# Patient Record
Sex: Male | Born: 1960 | Race: White | Hispanic: No | Marital: Married
Health system: Midwestern US, Community
[De-identification: ages and names within clinical notes are randomized; demographics above are authoritative.]

## PROBLEM LIST (undated history)

## (undated) DIAGNOSIS — K649 Unspecified hemorrhoids: Secondary | ICD-10-CM

## (undated) HISTORY — DX: Unspecified hemorrhoids: K64.9

## (undated) HISTORY — PX: OTHER SURGICAL HISTORY: SHX169

## (undated) HISTORY — PX: TONSILLECTOMY: SUR1361

---

## 2009-07-30 ENCOUNTER — Ambulatory Visit (HOSPITAL_COMMUNITY): Payer: Self-pay | Admitting: Psychology

## 2009-08-12 ENCOUNTER — Ambulatory Visit (HOSPITAL_COMMUNITY): Admission: RE | Admit: 2009-08-12 | Discharge: 2009-08-12 | Payer: Self-pay | Admitting: Psychiatry

## 2009-08-14 ENCOUNTER — Other Ambulatory Visit (HOSPITAL_COMMUNITY): Admission: RE | Admit: 2009-08-14 | Discharge: 2009-09-20 | Payer: Self-pay | Admitting: Psychiatry

## 2009-09-23 ENCOUNTER — Other Ambulatory Visit (HOSPITAL_COMMUNITY): Admission: RE | Admit: 2009-09-23 | Discharge: 2009-11-12 | Payer: Self-pay | Admitting: Psychiatry

## 2010-12-07 LAB — URINE DRUGS OF ABUSE SCREEN W ALC, ROUTINE (REF LAB)
Cocaine Metabolites: NEGATIVE
Cocaine Metabolites: NEGATIVE
Creatinine,U: 106 mg/dL
Creatinine,U: 99.7 mg/dL
Ethyl Alcohol: <10 mg/dL (ref <10)
Ethyl Alcohol: <10 mg/dL (ref <10)
Methadone: NEGATIVE
Opiate Screen, Urine: NEGATIVE
Opiate Screen, Urine: NEGATIVE
Phencyclidine (PCP): NEGATIVE
Phencyclidine (PCP): NEGATIVE

## 2010-12-08 LAB — URINE DRUGS OF ABUSE SCREEN W ALC, ROUTINE (REF LAB)
Cocaine Metabolites: NEGATIVE
Cocaine Metabolites: NEGATIVE
Creatinine,U: 66.3 mg/dL
Creatinine,U: 86.2 mg/dL
Methadone: NEGATIVE
Methadone: NEGATIVE
Phencyclidine (PCP): NEGATIVE
Phencyclidine (PCP): NEGATIVE

## 2010-12-11 LAB — URINE DRUGS OF ABUSE SCREEN W ALC, ROUTINE (REF LAB)
Benzodiazepines.: NEGATIVE
Cocaine Metabolites: NEGATIVE
Cocaine Metabolites: NEGATIVE
Creatinine,U: 69.8 mg/dL
Creatinine,U: 74.6 mg/dL
Methadone: NEGATIVE
Methadone: NEGATIVE
Phencyclidine (PCP): NEGATIVE
Phencyclidine (PCP): NEGATIVE
Propoxyphene: NEGATIVE
Propoxyphene: NEGATIVE

## 2010-12-22 LAB — URINE DRUGS OF ABUSE SCREEN W ALC, ROUTINE (REF LAB)
Benzodiazepines.: NEGATIVE
Cocaine Metabolites: NEGATIVE
Methadone: NEGATIVE
Phencyclidine (PCP): NEGATIVE
Propoxyphene: NEGATIVE

## 2010-12-23 LAB — URINE DRUGS OF ABUSE SCREEN W ALC, ROUTINE (REF LAB)
Barbiturate Quant, Ur: NEGATIVE
Barbiturate Quant, Ur: NEGATIVE
Benzodiazepines.: NEGATIVE
Benzodiazepines.: NEGATIVE
Marijuana Metabolite: NEGATIVE
Methadone: NEGATIVE
Methadone: NEGATIVE
Phencyclidine (PCP): NEGATIVE
Phencyclidine (PCP): NEGATIVE

## 2013-10-24 ENCOUNTER — Encounter: Payer: Self-pay | Admitting: Internal Medicine

## 2013-12-11 ENCOUNTER — Ambulatory Visit (AMBULATORY_SURGERY_CENTER): Payer: Self-pay

## 2013-12-11 VITALS — Ht 66.0 in | Wt 177.0 lb

## 2013-12-11 DIAGNOSIS — Z1211 Encounter for screening for malignant neoplasm of colon: Secondary | ICD-10-CM

## 2013-12-19 ENCOUNTER — Encounter: Payer: Self-pay | Admitting: Internal Medicine

## 2013-12-25 ENCOUNTER — Encounter: Payer: Self-pay | Admitting: Internal Medicine

## 2013-12-25 ENCOUNTER — Ambulatory Visit (AMBULATORY_SURGERY_CENTER): Payer: 59 | Admitting: Internal Medicine

## 2013-12-25 VITALS — BP 114/79 | HR 55 | Temp 98.3°F | Resp 22 | Ht 66.0 in | Wt 177.0 lb

## 2013-12-25 DIAGNOSIS — Z1211 Encounter for screening for malignant neoplasm of colon: Secondary | ICD-10-CM

## 2013-12-25 DIAGNOSIS — D126 Benign neoplasm of colon, unspecified: Secondary | ICD-10-CM

## 2013-12-25 MED ORDER — SODIUM CHLORIDE 0.9 % IV SOLN: 500.0000 mL | INTRAVENOUS | Status: DC

## 2013-12-25 MED ORDER — PRAMOXINE-HC 1-1 % EX CREA: TOPICAL_CREAM | Freq: Three times a day (TID) | CUTANEOUS | Status: DC

## 2013-12-25 NOTE — Progress Notes (Signed)
Called to room to assist during endoscopic procedure.  Patient ID and intended procedure confirmed with present staff. Received instructions for my participation in the procedure from the performing physician.  

## 2013-12-25 NOTE — Progress Notes (Signed)
Stable to RR 

## 2013-12-25 NOTE — Op Note (Signed)
[address] [place], [id number]   COLONOSCOPY PROCEDURE REPORT  PATIENT: James Arroyo, James Arroyo  MR#: [id number] BIRTHDATE: 1961/02/22 , 48  yrs. old GENDER: Male ENDOSCOPIST: Jerene Bears, MD REFERRED TD:DUKG Zigmund Daniel, M.D. PROCEDURE DATE:  12/25/2013 PROCEDURE:   Colonoscopy with cold biopsy polypectomy First Screening Colonoscopy - Avg.  risk and is 50 yrs.  old or older Yes.  Prior Negative Screening - Now for repeat screening. N/A  History of Adenoma - Now for follow-up colonoscopy & has been > or = to 3 yrs.  N/A  Polyps Removed Today? Yes. ASA CLASS:   Class I INDICATIONS:average risk screening and first colonoscopy. MEDICATIONS: MAC sedation, administered by CRNA and propofol (Diprivan) 250mg  IV  DESCRIPTION OF PROCEDURE:   After the risks benefits and alternatives of the procedure were thoroughly explained, informed consent was obtained.  A digital rectal exam revealed external hemorrhoids.   The LB [id number]  endoscope was introduced through the anus and advanced to the cecum, which was identified by both the appendix and ileocecal valve. No adverse events experienced.   The quality of the prep was excellent, using MoviPrep  The instrument was then slowly withdrawn as the colon was fully examined.   COLON FINDINGS: A sessile polyp measuring 3 mm in size was found at the cecum.  A polypectomy was performed with cold forceps.  The resection was complete and the polyp tissue was completely retrieved.   The colon mucosa was otherwise normal.  Retroflexed views revealed no abnormalities. The time to cecum=2 minutes 55 seconds.  Withdrawal time=8 minutes 47 seconds.  The scope was withdrawn and the procedure completed.  COMPLICATIONS: There were no complications.  ENDOSCOPIC IMPRESSION: 1.   Sessile polyp measuring 3 mm in size was found at the cecum; polypectomy was performed with cold forceps 2.   The colon mucosa was otherwise  normal  RECOMMENDATIONS: 1.  Await pathology results 2.  If the polyp removed today is proven to be an adenomatous (pre-cancerous) polyp, you will need a repeat colonoscopy in 5 years.  Otherwise you should continue to follow colorectal cancer screening guidelines for "routine risk" patients with colonoscopy in 10 years.  You will receive a letter within 1-2 weeks with the results of your biopsy as well as final recommendations.  Please call my office if you have not received a letter after 3 weeks.   eSigned:  Jerene Bears, MD 12/25/2013 10:02 AM cc: The Patient; Luetta Nutting, MD

## 2013-12-25 NOTE — Patient Instructions (Signed)
YOU HAD AN ENDOSCOPIC PROCEDURE TODAY AT THE [facility]: Refer to the procedure report that was given to you for any specific questions about what was found during the examination.  If the procedure report does not answer your questions, please call your gastroenterologist to clarify.  If you requested that your care partner not be given the details of your procedure findings, then the procedure report has been included in a sealed envelope for you to review at your convenience later.  YOU SHOULD EXPECT: Some feelings of bloating in the abdomen. Passage of more gas than usual.  Walking can help get rid of the air that was put into your GI tract during the procedure and reduce the bloating. If you had a lower endoscopy (such as a colonoscopy or flexible sigmoidoscopy) you may notice spotting of blood in your stool or on the toilet paper. If you underwent a bowel prep for your procedure, then you may not have a normal bowel movement for a few days.  DIET: Your first meal following the procedure should be a light meal and then it is ok to progress to your normal diet.  A half-sandwich or bowl of soup is an example of a good first meal.  Heavy or fried foods are harder to digest and may make you feel nauseous or bloated.  Likewise meals heavy in dairy and vegetables can cause extra gas to form and this can also increase the bloating.  Drink plenty of fluids but you should avoid alcoholic beverages for 24 hours.  ACTIVITY: Your care partner should take you home directly after the procedure.  You should plan to take it easy, moving slowly for the rest of the day.  You can resume normal activity the day after the procedure however you should NOT DRIVE or use heavy machinery for 24 hours (because of the sedation medicines used during the test).    SYMPTOMS TO REPORT IMMEDIATELY: A gastroenterologist can be reached at any hour.  During normal business hours, 8:30 AM to 5:00 PM Monday through Friday,  call [phone].  After hours and on weekends, please call the GI answering service at [phone] who will take a message and have the physician on call contact you.   Following lower endoscopy (colonoscopy or flexible sigmoidoscopy):  Excessive amounts of blood in the stool  Significant tenderness or worsening of abdominal pains  Swelling of the abdomen that is new, acute  Fever of 100F or higher    FOLLOW UP: If any biopsies were taken you will be contacted by phone or by letter within the next 1-3 weeks.  Call your gastroenterologist if you have not heard about the biopsies in 3 weeks.  Our staff will call the home number listed on your records the next business day following your procedure to check on you and address any questions or concerns that you may have at that time regarding the information given to you following your procedure. This is a courtesy call and so if there is no answer at the home number and we have not heard from you through the emergency physician on call, we will assume that you have returned to your regular daily activities without incident.  SIGNATURES/CONFIDENTIALITY: You and/or your care partner have signed paperwork which will be entered into your electronic medical record.  These signatures attest to the fact that that the information above on your After Visit Summary has been reviewed and is understood.  Full responsibility of the confidentiality   of this discharge information lies with you and/or your care-partner.    Information on polyps given to you today 

## 2013-12-26 ENCOUNTER — Telehealth: Payer: Self-pay | Admitting: *Deleted

## 2013-12-26 NOTE — Telephone Encounter (Signed)
  Follow up Call-  Call back number 12/25/2013  Post procedure Call Back phone  # [phone]  Permission to leave phone message Yes    [facility]

## 2013-12-31 ENCOUNTER — Encounter: Payer: Self-pay | Admitting: Internal Medicine

## 2015-07-30 ENCOUNTER — Ambulatory Visit (INDEPENDENT_AMBULATORY_CARE_PROVIDER_SITE_OTHER): Payer: 59 | Admitting: Internal Medicine

## 2015-07-30 ENCOUNTER — Encounter: Payer: Self-pay | Admitting: Internal Medicine

## 2015-07-30 ENCOUNTER — Encounter (INDEPENDENT_AMBULATORY_CARE_PROVIDER_SITE_OTHER): Payer: Self-pay

## 2015-07-30 VITALS — BP 136/86 | HR 61 | Temp 98.8°F | Ht 66.0 in | Wt 178.8 lb

## 2015-07-30 DIAGNOSIS — K649 Unspecified hemorrhoids: Secondary | ICD-10-CM | POA: Insufficient documentation

## 2015-07-30 DIAGNOSIS — B181 Chronic viral hepatitis B without delta-agent: Secondary | ICD-10-CM

## 2015-07-30 DIAGNOSIS — B1910 Unspecified viral hepatitis B without hepatic coma: Secondary | ICD-10-CM | POA: Insufficient documentation

## 2015-07-30 NOTE — Progress Notes (Signed)
[facility] for Infectious Disease  Reason for Consult: Hepatitis B Referring Physician: Dr. Luetta Nutting  Patient Active Problem List   Diagnosis Date Noted  . Hepatitis B 07/30/2015    Priority: High  . Hemorrhoids 07/30/2015    Patient's Medications  New Prescriptions   No medications on file  Previous Medications   ASPIRIN EC 81 MG TABLET    Take 81 mg by mouth daily.   MELATONIN 3 MG CAPS    Take 3 mg by mouth at bedtime.   NAPROXEN SODIUM 220 MG CAPS    Take by mouth.   PRAMOXINE-HYDROCORTISONE (ANALPRAM HC) CREAM    Apply topically 3 (three) times daily.   VALERIAN 500 MG CAPS    Take 500 mg by mouth at bedtime.  Modified Medications   No medications on file  Discontinued Medications   No medications on file    Recommendations: 1. Check hepatitis B DNA viral load. 2. Check hepatitis A and hepatitis D antibodies  3. Right upper quadrant ultrasound with elastography 4. Wife will start hepatitis B vaccine series with her primary care provider 5. 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 I will complete staging by checking his hepatitis B DNA viral load and ultrasound with elastography. He will also need to have his hepatitis A and D antibodies checked. He will follow-up with me in 3 weeks to review the results of these tests and determine if he needs treatment.   HPI: James Arroyo is a 54 y.o. male who has been in good health. He recently donated blood at his church and received a letter from the [employer] stating that he had hepatitis B. He recalls donating blood once previously about a year ago and was never told anything about hepatitis at that time. He recalls being told that he had a transfusion when he was a neonate but is never had any other blood products. His mother is dead but he has no reason to believe she ever had hepatitis. He has never had tattoos are used injecting drugs. He's been married  to his current wife for about one year. She has hepatitis B negative and will be starting the vaccine soon. They have not been sexually active since he was diagnosed. 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  Review of Systems: Review of Systems  Constitutional: Negative for fever, chills, weight loss, malaise/fatigue and diaphoresis.       He is well dressed and in no distress.  HENT: Negative for sore throat.   Respiratory: Negative for cough, sputum production and shortness of breath.   Cardiovascular: Negative for chest pain and leg swelling.  Gastrointestinal: Negative for nausea, vomiting, abdominal pain and diarrhea.  Genitourinary: Negative for dysuria.  Musculoskeletal: Negative for myalgias and joint pain.  Skin: Negative for itching and rash.  Neurological: Negative for focal weakness and headaches.  Psychiatric/Behavioral: Negative for depression and substance abuse. The patient is not nervous/anxious.       Past Medical History  Diagnosis Date  . Hemorrhoids     Social History  Substance Use Topics  . Smoking status: Former Research scientist (life sciences)  . Smokeless tobacco: Never Used  . Alcohol Use: No     Comment: Recovering alcoholic    Family History  Problem Relation Age of Onset  . Colon cancer Neg Hx   . Pancreatic cancer Neg Hx   .  Stomach cancer Neg Hx   . Cancer Mother   . Kidney disease Father   . Hypertension Father    No Known Allergies  OBJECTIVE: Filed Vitals:   07/30/15 0916  BP: 136/86  Pulse: 61  Temp: 98.8 F (37.1 C)  TempSrc: Oral  Height: 5\' 6"  (1.676 m)  Weight: 178 lb 12.8 oz (81.103 kg)   Body mass index is 28.87 kg/(m^2).   Physical Exam  Constitutional: He is oriented to person, place, and time.  Eyes: Conjunctivae are normal.  Cardiovascular: Normal rate and regular rhythm.   No murmur heard. Pulmonary/Chest: Breath sounds normal.  Abdominal: Soft. Bowel sounds are normal. He exhibits no distension and no  mass. There is no tenderness.  Musculoskeletal: Normal range of motion.  Neurological: He is alert and oriented to person, place, and time.  Skin: No rash noted.  Psychiatric: Mood and affect normal.    Microbiology: No results found for this or any previous visit (from the past 240 hour(s)).  Michel Bickers, MD [facility] for Infectious [facility] [phone] pager   [phone] cell 07/30/2015, 12:21 PM

## 2015-07-30 NOTE — Assessment & Plan Note (Signed)
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 I will complete staging by checking his hepatitis B DNA viral load and ultrasound with elastography. He will also need to have his hepatitis A and D antibodies checked. He will follow-up with me in 3 weeks to review the results of these tests and determine if he needs treatment.

## 2015-08-03 LAB — HEPATITIS DELTA ANTIBODY: Hepatitis D Ab, Total: NEGATIVE

## 2015-08-13 ENCOUNTER — Ambulatory Visit (HOSPITAL_COMMUNITY)
Admission: RE | Admit: 2015-08-13 | Discharge: 2015-08-13 | Disposition: A | Discharge status: Home / Self Care | Payer: 59 | Source: Ambulatory Visit | Attending: Internal Medicine | Admitting: Internal Medicine

## 2015-08-13 DIAGNOSIS — B1910 Unspecified viral hepatitis B without hepatic coma: Secondary | ICD-10-CM | POA: Diagnosis present

## 2015-08-13 DIAGNOSIS — B181 Chronic viral hepatitis B without delta-agent: Secondary | ICD-10-CM | POA: Diagnosis not present

## 2015-08-22 ENCOUNTER — Ambulatory Visit (HOSPITAL_COMMUNITY): Payer: 59

## 2015-08-27 ENCOUNTER — Other Ambulatory Visit: Payer: 59 | Admitting: Internal Medicine

## 2015-08-27 ENCOUNTER — Other Ambulatory Visit: Payer: Self-pay | Admitting: Internal Medicine

## 2015-08-27 ENCOUNTER — Other Ambulatory Visit: Payer: 59

## 2015-08-27 DIAGNOSIS — B181 Chronic viral hepatitis B without delta-agent: Secondary | ICD-10-CM

## 2015-08-28 LAB — HEPATITIS A ANTIBODY, TOTAL: Hep A Total Ab: NONREACTIVE

## 2015-08-29 LAB — HEPATITIS B E ANTIBODY: Hepatitis Be Antibody: REACTIVE — AB

## 2015-08-29 LAB — HEPATITIS B E ANTIGEN: Hepatitis Be Antigen: NONREACTIVE

## 2015-09-01 LAB — HEPATITIS B DNA, ULTRAQUANTITATIVE, PCR
HEPATITIS B DNA (CALC): 2.08 {Log_IU}/mL — AB (ref <1.30)
HEPATITIS B DNA: 119 [IU]/mL — AB (ref <20)

## 2015-09-09 ENCOUNTER — Ambulatory Visit (INDEPENDENT_AMBULATORY_CARE_PROVIDER_SITE_OTHER): Payer: 59 | Admitting: Internal Medicine

## 2015-09-09 ENCOUNTER — Encounter: Payer: Self-pay | Admitting: Internal Medicine

## 2015-09-09 VITALS — BP 134/87 | HR 60 | Temp 98.2°F | Wt 178.0 lb

## 2015-09-09 DIAGNOSIS — F1020 Alcohol dependence, uncomplicated: Secondary | ICD-10-CM | POA: Insufficient documentation

## 2015-09-09 DIAGNOSIS — F1021 Alcohol dependence, in remission: Secondary | ICD-10-CM | POA: Diagnosis not present

## 2015-09-09 DIAGNOSIS — B181 Chronic viral hepatitis B without delta-agent: Secondary | ICD-10-CM

## 2015-09-09 NOTE — Assessment & Plan Note (Signed)
He has hepatitis B E antigen negative with a very low DNA viral load and no evidence of cirrhosis. I will not start any treatment. He will follow-up after lab work in 6 months.

## 2015-09-09 NOTE — Progress Notes (Signed)
[facility] for Infectious Disease  Patient Active Problem List   Diagnosis Date Noted  . Hepatitis B 07/30/2015    Priority: High  . Alcohol dependence ([place]) 09/09/2015  . Hemorrhoids 07/30/2015    Patient's Medications  New Prescriptions   No medications on file  Previous Medications   ASPIRIN EC 81 MG TABLET    Take 81 mg by mouth daily.   NAPROXEN SODIUM 220 MG CAPS    Take by mouth.   VALERIAN 500 MG CAPS    Take 500 mg by mouth at bedtime.  Modified Medications   No medications on file  Discontinued Medications   MELATONIN 3 MG CAPS    Take 3 mg by mouth at bedtime.   PRAMOXINE-HYDROCORTISONE (ANALPRAM HC) CREAM    Apply topically 3 (three) times daily.    Subjective: James Arroyo is in with his wife for his routine follow-up visit. She started her hepatitis B vaccine recently. He confirmed that he no longer drinks alcohol. He is feeling well and is only bothered by dry skin on his legs which is a chronic problem.  Review of Systems: Review of Systems  Constitutional: Negative for fever, chills, weight loss, malaise/fatigue and diaphoresis.  HENT: Negative for sore throat.   Respiratory: Negative for cough, sputum production and shortness of breath.   Cardiovascular: Negative for chest pain.  Gastrointestinal: Negative for nausea, vomiting and diarrhea.  Genitourinary: Negative for dysuria and frequency.  Musculoskeletal: Negative for myalgias and joint pain.  Skin: Negative for itching and rash.  Neurological: Negative for focal weakness.  Psychiatric/Behavioral: Negative for depression and substance abuse. The patient is not nervous/anxious.     Past Medical History  Diagnosis Date  . Hemorrhoids     Social History  Substance Use Topics  . Smoking status: Former Research scientist (life sciences)  . Smokeless tobacco: Never Used  . Alcohol Use: No     Comment: Recovering alcoholic    Family History  Problem Relation Age of Onset  . Colon cancer Neg Hx   .  Pancreatic cancer Neg Hx   . Stomach cancer Neg Hx   . Cancer Mother   . Kidney disease Father   . Hypertension Father     No Known Allergies  Objective: Filed Vitals:   09/09/15 1612  BP: 134/87  Pulse: 60  Temp: 98.2 F (36.8 C)  TempSrc: Oral  Weight: 178 lb (80.74 kg)   Body mass index is 28.74 kg/(m^2).  Physical Exam  Constitutional: No distress.  Abdominal: Soft. Bowel sounds are normal. He exhibits no mass. There is no tenderness.  Skin: No rash noted.  Psychiatric: Mood and affect normal.    Lab Results Hepatitis B E antigen negative Hepatitis B E antigen antibody positive Hepatitis B DNA viral load 119 international units per mL Hepatitis A antibody negative Hepatitis D antibody negative  Ultrasound with elastography 08/13/2015  IMPRESSION: Negative abdomen ultrasound. No hepatobiliary or other significant abnormality identified.  Median hepatic shear wave velocity is calculated at 1.39 m/sec.  Corresponding Metavir fibrosis score is F2 +some F3.  Risk of fibrosis is moderate.  Follow-up: Additional testing appropriate   Electronically Signed  By: Earle Gell M.D.   Problem List Items Addressed This Visit      High   Hepatitis B    He has hepatitis B E antigen negative with a very low DNA viral load and no evidence of cirrhosis. I will not start any treatment. He will  follow-up after lab work in 6 months.      Relevant Orders   Comprehensive metabolic panel   Hepatitis B DNA, ultraquantitative, PCR   Hepatitis B e antibody   Hepatitis B e antigen     Unprioritized   Alcohol dependence ([place]) - Primary       Michel Bickers, MD [facility] for Infectious [place] [phone] pager   [phone] cell 09/09/2015, 5:14 PM

## 2016-02-27 ENCOUNTER — Other Ambulatory Visit: Payer: 59

## 2016-02-27 DIAGNOSIS — B181 Chronic viral hepatitis B without delta-agent: Secondary | ICD-10-CM

## 2016-02-27 LAB — COMPREHENSIVE METABOLIC PANEL
ALBUMIN: 4.2 g/dL (ref 3.6–5.1)
ALK PHOS: 57 U/L (ref 40–115)
ALT: 32 U/L (ref 9–46)
AST: 27 U/L (ref 10–35)
BILIRUBIN TOTAL: 1.1 mg/dL (ref 0.2–1.2)
BUN: 10 mg/dL (ref 7–25)
CALCIUM: 9.1 mg/dL (ref 8.6–10.3)
CO2: 24 mmol/L (ref 20–31)
Chloride: 103 mmol/L (ref 98–110)
Creat: 1 mg/dL (ref 0.70–1.33)
GLUCOSE: 101 mg/dL — AB (ref 65–99)
Potassium: 4.8 mmol/L (ref 3.5–5.3)
Sodium: 138 mmol/L (ref 135–146)
TOTAL PROTEIN: 7.2 g/dL (ref 6.1–8.1)

## 2016-02-27 LAB — HEPATITIS C ANTIBODY: HCV Ab: NEGATIVE

## 2016-02-28 LAB — HEPATITIS B DNA, ULTRAQUANTITATIVE, PCR
HEPATITIS B DNA: 144 [IU]/mL — AB (ref <20)
Hepatitis B DNA (Calc): 2.16 Log IU/mL — ABNORMAL HIGH (ref <1.30)

## 2016-03-02 LAB — HEPATITIS B E ANTIGEN: Hepatitis Be Antigen: NONREACTIVE

## 2016-03-02 LAB — HEPATITIS B E ANTIBODY: HEPATITIS BE ANTIBODY: REACTIVE — AB

## 2016-03-12 ENCOUNTER — Encounter: Payer: Self-pay | Admitting: Internal Medicine

## 2016-03-12 ENCOUNTER — Ambulatory Visit (INDEPENDENT_AMBULATORY_CARE_PROVIDER_SITE_OTHER): Payer: 59 | Admitting: Internal Medicine

## 2016-03-12 DIAGNOSIS — B181 Chronic viral hepatitis B without delta-agent: Secondary | ICD-10-CM

## 2016-03-12 NOTE — Progress Notes (Signed)
Patient ID: James Arroyo, male   DOB: 03/09/1961, 55 y.o.   MRN: [id number]         [facility] for Infectious Disease  Patient Active Problem List   Diagnosis Date Noted  . Hepatitis B 07/30/2015    Priority: High  . Alcohol dependence ([place]) 09/09/2015  . Hemorrhoids 07/30/2015    Patient's Medications  New Prescriptions   No medications on file  Previous Medications   ASPIRIN EC 81 MG TABLET    Take 81 mg by mouth daily.   NAPROXEN SODIUM 220 MG CAPS    Take by mouth.   VALERIAN 500 MG CAPS    Take 500 mg by mouth at bedtime.  Modified Medications   No medications on file  Discontinued Medications   No medications on file    Subjective: James Arroyo is in for his routine follow-up visit. He is doing well and without complaint. He continues completely sober and free of alcohol use. His wife has completed her hepatitis B vaccine.  Review of Systems: Review of Systems  Constitutional: Negative for fever, chills, weight loss, malaise/fatigue and diaphoresis.  HENT: Negative for sore throat.   Respiratory: Negative for cough, sputum production and shortness of breath.   Cardiovascular: Negative for chest pain.  Gastrointestinal: Negative for nausea, vomiting and diarrhea.  Genitourinary: Negative for dysuria and frequency.  Musculoskeletal: Negative for myalgias and joint pain.  Skin: Negative for itching and rash.  Neurological: Negative for focal weakness.  Psychiatric/Behavioral: Negative for depression and substance abuse. The patient is not nervous/anxious.     Past Medical History  Diagnosis Date  . Hemorrhoids     Social History  Substance Use Topics  . Smoking status: Former Research scientist (life sciences)  . Smokeless tobacco: Never Used  . Alcohol Use: No     Comment: Recovering alcoholic    Family History  Problem Relation Age of Onset  . Colon cancer Neg Hx   . Pancreatic cancer Neg Hx   . Stomach cancer Neg Hx   . Cancer Mother   . Kidney disease Father   .  Hypertension Father     No Known Allergies  Objective: Filed Vitals:   03/12/16 0843  BP: 119/78  Pulse: 66  Temp: 97.8 F (36.6 C)  TempSrc: Oral  Height: 5' 6.25" (1.683 m)  Weight: 179 lb (81.194 kg)   Body mass index is 28.67 kg/(m^2).  Physical Exam  Constitutional: No distress.  Abdominal: Soft. Bowel sounds are normal. He exhibits no mass. There is no tenderness.  Skin: No rash noted.  Psychiatric: Mood and affect normal.    Lab Results Hepatitis B E antigen negative Hepatitis B E antigen antibody positive Hepatitis B DNA viral load 144 international units per mL Hepatitis C antibody negative   Ultrasound with elastography 08/13/2015  IMPRESSION: Negative abdomen ultrasound. No hepatobiliary or other significant abnormality identified.  Median hepatic shear wave velocity is calculated at 1.39 m/sec.  Corresponding Metavir fibrosis score is F2 +some F3.  Risk of fibrosis is moderate.  Follow-up: Additional testing appropriate   Electronically Signed  By: Earle Gell M.D.   Problem List Items Addressed This Visit      High   Hepatitis B    He remains e antigen positive with normal liver enzymes and a very low DNA viral load. There is no indication at this time to start therapy for chronic hepatitis B. He will follow-up after lab work in 12 months.      Relevant Orders  Hepatitis B DNA, ultraquantitative, PCR   Hepatitis B e antibody   Hepatitis B e antigen   Comprehensive metabolic panel   AFP tumor marker       James Bickers, MD [facility] for Infectious [place] [phone] pager   [phone] cell 03/12/2016, 9:12 AM

## 2016-03-12 NOTE — Assessment & Plan Note (Signed)
He remains e antigen positive with normal liver enzymes and a very low DNA viral load. There is no indication at this time to start therapy for chronic hepatitis B. He will follow-up after lab work in 12 months.

## 2017-02-25 ENCOUNTER — Other Ambulatory Visit: Payer: 59

## 2017-02-25 DIAGNOSIS — B181 Chronic viral hepatitis B without delta-agent: Secondary | ICD-10-CM

## 2017-02-25 LAB — COMPREHENSIVE METABOLIC PANEL
ALK PHOS: 54 U/L (ref 40–115)
ALT: 19 U/L (ref 9–46)
AST: 17 U/L (ref 10–35)
Albumin: 4.3 g/dL (ref 3.6–5.1)
BUN: 11 mg/dL (ref 7–25)
CHLORIDE: 104 mmol/L (ref 98–110)
CO2: 25 mmol/L (ref 20–31)
CREATININE: 0.95 mg/dL (ref 0.70–1.33)
Calcium: 9.4 mg/dL (ref 8.6–10.3)
GLUCOSE: 98 mg/dL (ref 65–99)
Potassium: 4.8 mmol/L (ref 3.5–5.3)
SODIUM: 137 mmol/L (ref 135–146)
Total Bilirubin: 0.8 mg/dL (ref 0.2–1.2)
Total Protein: 7.1 g/dL (ref 6.1–8.1)

## 2017-02-26 LAB — AFP TUMOR MARKER: AFP TUMOR MARKER: 4.4 ng/mL (ref <6.1)

## 2017-02-27 LAB — HEPATITIS B DNA, ULTRAQUANTITATIVE, PCR
HEPATITIS B DNA: 1030 [IU]/mL — AB
Hepatitis B DNA (Calc): 3.01 Log IU/mL — ABNORMAL HIGH

## 2017-03-01 LAB — HEPATITIS B E ANTIGEN: Hepatitis Be Antigen: NONREACTIVE

## 2017-03-01 LAB — HEPATITIS B E ANTIBODY: HEPATITIS BE ANTIBODY: REACTIVE — AB

## 2017-03-11 ENCOUNTER — Ambulatory Visit (INDEPENDENT_AMBULATORY_CARE_PROVIDER_SITE_OTHER): Payer: 59 | Admitting: Internal Medicine

## 2017-03-11 ENCOUNTER — Encounter: Payer: Self-pay | Admitting: Internal Medicine

## 2017-03-11 DIAGNOSIS — B169 Acute hepatitis B without delta-agent and without hepatic coma: Secondary | ICD-10-CM | POA: Diagnosis not present

## 2017-03-11 NOTE — Assessment & Plan Note (Signed)
There is no change in his hepatitis B labs from his previous visits. He has no current indication to start on treatment. He will return in one year after repeat lab work and repeat ultrasound with elastography.

## 2017-03-11 NOTE — Progress Notes (Signed)
         [facility] for Infectious Disease  Patient Active Problem List   Diagnosis Date Noted  . Hepatitis B 07/30/2015    Priority: High  . Alcohol dependence ([place]) 09/09/2015  . Hemorrhoids 07/30/2015    Patient's Medications  New Prescriptions   No medications on file  Previous Medications   ASPIRIN EC 81 MG TABLET    Take 81 mg by mouth daily.   MELOXICAM (MOBIC) 15 MG TABLET    Take 15 mg by mouth daily.   VALERIAN 500 MG CAPS    Take 500 mg by mouth at bedtime.  Modified Medications   No medications on file  Discontinued Medications   NAPROXEN SODIUM 220 MG CAPS    Take by mouth.    Subjective: James Arroyo is in for his routine follow-up visit. He is doing well except for some recently diagnosed hip arthritis and venous insufficiency causing leg swelling. He fell off the wagon 9 months ago and drank alcohol. He states that was a one-day vacation but he quickly quit and has been sober for 9 months.  Review of Systems: Review of Systems  Constitutional: Negative for chills, diaphoresis, fever, malaise/fatigue and weight loss.  Gastrointestinal: Negative for abdominal pain, diarrhea, nausea and vomiting.  Psychiatric/Behavioral: Negative for substance abuse.    Past Medical History:  Diagnosis Date  . Hemorrhoids     Social History  Substance Use Topics  . Smoking status: Former Research scientist (life sciences)  . Smokeless tobacco: Never Used  . Alcohol use No     Comment: Recovering alcoholic    Family History  Problem Relation Age of Onset  . Colon cancer Neg Hx   . Pancreatic cancer Neg Hx   . Stomach cancer Neg Hx   . Cancer Mother   . Kidney disease Father   . Hypertension Father     No Known Allergies  Objective: Vitals:   03/11/17 0848  BP: 136/89  Pulse: 64  Temp: 98.1 F (36.7 C)  TempSrc: Oral  Weight: 166 lb (75.3 kg)   Body mass index is 26.59 kg/m.  Physical Exam  Constitutional: He is oriented to person, place, and time.  He is in good  spirits.  Eyes: Conjunctivae are normal.  Cardiovascular: Normal rate and regular rhythm.   No murmur heard. Pulmonary/Chest: Effort normal and breath sounds normal.  Abdominal: Soft. He exhibits no distension and no mass. There is no tenderness.  Neurological: He is alert and oriented to person, place, and time.  Skin: No rash noted.  Psychiatric: Affect normal.    Lab Results Liver enzymes normal Hepatitis B E antigen negative and E antibody positive Hepatitis B DNA viral load 1030 Alpha-fetoprotein level normal   Problem List Items Addressed This Visit      High   Hepatitis B    There is no change in his hepatitis B labs from his previous visits. He has no current indication to start on treatment. He will return in one year after repeat lab work and repeat ultrasound with elastography.      Relevant Orders   Hepatitis B DNA, ultraquantitative, PCR   Hepatitis B e antibody   Hepatitis B e antigen   Comprehensive metabolic panel   US ABDOMEN COMPLETE Toy Baker, [place] [facility] [phone] pager   [phone] cell 03/11/2017, 9:02 AM

## 2017-05-07 ENCOUNTER — Encounter (HOSPITAL_COMMUNITY): Payer: Self-pay | Admitting: *Deleted

## 2017-05-07 NOTE — Progress Notes (Signed)
Need orders in epic for 9-12 surgery

## 2017-05-11 ENCOUNTER — Ambulatory Visit: Payer: Self-pay | Admitting: Orthopedic Surgery

## 2017-05-19 ENCOUNTER — Ambulatory Visit: Payer: Self-pay | Admitting: Orthopedic Surgery

## 2017-05-19 NOTE — H&P (Signed)
TOTAL HIP ADMISSION H&P  Patient is admitted for right total hip arthroplasty.  Subjective:  Chief Complaint: right hip pain  HPI: James Arroyo, 56 y.o. male, has a history of pain and functional disability in the right hip(s) due to arthritis and patient has failed non-surgical conservative treatments for greater than 12 weeks to include NSAID's and/or analgesics, flexibility and strengthening excercises, supervised PT with diminished ADL's post treatment, use of assistive devices and activity modification.  Onset of symptoms was gradual starting 1 years ago with rapidlly worsening course since that time.The patient noted no past surgery on the right hip(s).  Patient currently rates pain in the right hip at 10 out of 10 with activity. Patient has night pain, worsening of pain with activity and weight bearing, pain that interfers with activities of daily living, pain with passive range of motion and crepitus. Patient has evidence of subchondral cysts, subchondral sclerosis, periarticular osteophytes and joint space narrowing by imaging studies. This condition presents safety issues increasing the risk of falls. There is no current active infection.  Patient Active Problem List   Diagnosis Date Noted  . Alcohol dependence ([place]) 09/09/2015  . Hepatitis B 07/30/2015  . Hemorrhoids 07/30/2015   Past Medical History:  Diagnosis Date  . Hemorrhoids     Past Surgical History:  Procedure Laterality Date  . cyst eyelids    . TONSILLECTOMY       (Not in a hospital admission) No Known Allergies  Social History  Substance Use Topics  . Smoking status: Former Research scientist (life sciences)  . Smokeless tobacco: Never Used  . Alcohol use No     Comment: Recovering alcoholic    Family History  Problem Relation Age of Onset  . Cancer Mother   . Kidney disease Father   . Hypertension Father   . Colon cancer Neg Hx   . Pancreatic cancer Neg Hx   . Stomach cancer Neg Hx      Review of Systems  Constitutional:  Negative.   HENT: Positive for tinnitus.   Eyes: Negative.   Respiratory: Negative.   Cardiovascular: Positive for leg swelling.  Gastrointestinal: Positive for heartburn.  Genitourinary: Negative.   Musculoskeletal: Positive for back pain and joint pain.  Skin: Negative.   Neurological: Negative.   Endo/Heme/Allergies: Negative.   Psychiatric/Behavioral: Negative.     Objective:  Physical Exam  Vitals reviewed. Constitutional: He is oriented to person, place, and time. He appears well-developed and well-nourished.  HENT:  Head: Normocephalic and atraumatic.  Eyes: Pupils are equal, round, and reactive to light. Conjunctivae and EOM are normal.  Neck: Normal range of motion. Neck supple.  Cardiovascular: Normal rate, regular rhythm and intact distal pulses.   Respiratory: Effort normal. No respiratory distress.  GI: Soft. He exhibits no distension.  Genitourinary:  Genitourinary Comments: deferred  Musculoskeletal:       Right hip: He exhibits decreased range of motion, bony tenderness and crepitus.  Neurological: He is alert and oriented to person, place, and time. He has normal reflexes.  Skin: Skin is warm and dry.  Psychiatric: He has a normal mood and affect. His behavior is normal. Judgment and thought content normal.    Vital signs in last 24 hours: @VSRANGES @  Labs:   Estimated body mass index is 26.59 kg/m as calculated from the following:   Height as of 03/12/16: 5' 6.25" (1.683 m).   Weight as of 03/11/17: 75.3 kg (166 lb).   Imaging Review Plain radiographs demonstrate severe degenerative joint disease of  the right hip(s). The bone quality appears to be adequate for age and reported activity level.  Assessment/Plan:  End stage arthritis, right hip(s)  The patient history, physical examination, clinical judgement of the provider and imaging studies are consistent with end stage degenerative joint disease of the right hip(s) and total hip arthroplasty is  deemed medically necessary. The treatment options including medical management, injection therapy, arthroscopy and arthroplasty were discussed at length. The risks and benefits of total hip arthroplasty were presented and reviewed. The risks due to aseptic loosening, infection, stiffness, dislocation/subluxation,  thromboembolic complications and other imponderables were discussed.  The patient acknowledged the explanation, agreed to proceed with the plan and consent was signed. Patient is being admitted for inpatient treatment for surgery, pain control, PT, OT, prophylactic antibiotics, VTE prophylaxis, progressive ambulation and ADL's and discharge planning.The patient is planning to be discharged home with HEP

## 2017-05-21 NOTE — Patient Instructions (Signed)
James Arroyo  05/21/2017   Your procedure is scheduled on: 06-03-17  Report to [facility] Main  Entrance Take Lupita Leash  Elevators to 3rd floor to  [place] at 5:30 AM.   Call this number if you have problems the morning of surgery [phone]    Remember: ONLY 1 PERSON MAY GO WITH YOU TO SHORT STAY TO GET  READY MORNING OF [place].  Do not eat food or drink liquids :After Midnight.     Take these medicines the morning of surgery with A SIP OF WATER: None                                You may not have any metal on your body including hair pins and              piercings  Do not wear jewelry, make-up, lotions, powders or perfumes, deodorant             Men may shave face and neck.   Do not bring valuables to the hospital. [place].  Contacts, dentures or bridgework may not be worn into surgery.  Leave suitcase in the car. After surgery it may be brought to your room.                 Please read over the following fact sheets you were given: _____________________________________________________________________            [facility] - Preparing for Surgery Before surgery, you can play an important role.  Because skin is not sterile, your skin needs to be as free of germs as possible.  You can reduce the number of germs on your skin by washing with CHG (chlorahexidine gluconate) soap before surgery.  CHG is an antiseptic cleaner which kills germs and bonds with the skin to continue killing germs even after washing. Please DO NOT use if you have an allergy to CHG or antibacterial soaps.  If your skin becomes reddened/irritated stop using the CHG and inform your nurse when you arrive at Short Stay. Do not shave (including legs and underarms) for at least 48 hours prior to the first CHG shower.  You may shave your face/neck. Please follow these instructions carefully:  1.  Shower with CHG Soap  the night before surgery and the  morning of Surgery.  2.  If you choose to wash your hair, wash your hair first as usual with your  normal  shampoo.  3.  After you shampoo, rinse your hair and body thoroughly to remove the  shampoo.                           4.  Use CHG as you would any other liquid soap.  You can apply chg directly  to the skin and wash                       Gently with a scrungie or clean washcloth.  5.  Apply the CHG Soap to your body ONLY FROM THE NECK DOWN.   Do not use on face/ open  Wound or open sores. Avoid contact with eyes, ears mouth and genitals (private parts).                       Wash face,  Genitals (private parts) with your normal soap.             6.  Wash thoroughly, paying special attention to the area where your surgery  will be performed.  7.  Thoroughly rinse your body with warm water from the neck down.  8.  DO NOT shower/wash with your normal soap after using and rinsing off  the CHG Soap.                9.  Pat yourself dry with a clean towel.            10.  Wear clean pajamas.            11.  Place clean sheets on your bed the night of your first shower and do not  sleep with pets. Day of Surgery : Do not apply any lotions/deodorants the morning of surgery.  Please wear clean clothes to the hospital/surgery center.  FAILURE TO FOLLOW THESE INSTRUCTIONS MAY RESULT IN THE CANCELLATION OF YOUR SURGERY PATIENT SIGNATURE_________________________________  NURSE SIGNATURE__________________________________  ________________________________________________________________________   Adam Phenix  An incentive spirometer is a tool that can help keep your lungs clear and active. This tool measures how well you are filling your lungs with each breath. Taking long deep breaths may help reverse or decrease the chance of developing breathing (pulmonary) problems (especially infection) following:  A long period of time when  you are unable to move or be active. BEFORE THE PROCEDURE   If the spirometer includes an indicator to show your best effort, your nurse or respiratory therapist will set it to a desired goal.  If possible, sit up straight or lean slightly forward. Try not to slouch.  Hold the incentive spirometer in an upright position. INSTRUCTIONS FOR USE  1. Sit on the edge of your bed if possible, or sit up as far as you can in bed or on a chair. 2. Hold the incentive spirometer in an upright position. 3. Breathe out normally. 4. Place the mouthpiece in your mouth and seal your lips tightly around it. 5. Breathe in slowly and as deeply as possible, raising the piston or the ball toward the top of the column. 6. Hold your breath for 3-5 seconds or for as long as possible. Allow the piston or ball to fall to the bottom of the column. 7. Remove the mouthpiece from your mouth and breathe out normally. 8. Rest for a few seconds and repeat Steps 1 through 7 at least 10 times every 1-2 hours when you are awake. Take your time and take a few normal breaths between deep breaths. 9. The spirometer may include an indicator to show your best effort. Use the indicator as a goal to work toward during each repetition. 10. After each set of 10 deep breaths, practice coughing to be sure your lungs are clear. If you have an incision (the cut made at the time of surgery), support your incision when coughing by placing a pillow or rolled up towels firmly against it. Once you are able to get out of bed, walk around indoors and cough well. You may stop using the incentive spirometer when instructed by your caregiver.  RISKS AND COMPLICATIONS  Take your time so you do not get  dizzy or light-headed.  If you are in pain, you may need to take or ask for pain medication before doing incentive spirometry. It is harder to take a deep breath if you are having pain. AFTER USE  Rest and breathe slowly and easily.  It can be  helpful to keep track of a log of your progress. Your caregiver can provide you with a simple table to help with this. If you are using the spirometer at home, follow these instructions: [place] IF:   You are having difficultly using the spirometer.  You have trouble using the spirometer as often as instructed.  Your pain medication is not giving enough relief while using the spirometer.  You develop fever of 100.5 F (38.1 C) or higher. SEEK IMMEDIATE MEDICAL CARE IF:   You cough up bloody sputum that had not been present before.  You develop fever of 102 F (38.9 C) or greater.  You develop worsening pain at or near the incision site. MAKE SURE YOU:   Understand these instructions.  Will watch your condition.  Will get help right away if you are not doing well or get worse. Document Released: 01/18/2007 Document Revised: 11/30/2011 Document Reviewed: 03/21/2007 ExitCare Patient Information 2014 ExitCare, [place].   ________________________________________________________________________  WHAT IS A BLOOD TRANSFUSION? Blood Transfusion Information  A transfusion is the replacement of blood or some of its parts. Blood is made up of multiple cells which provide different functions.  Red blood cells carry oxygen and are used for blood loss replacement.  White blood cells fight against infection.  Platelets control bleeding.  Plasma helps clot blood.  Other blood products are available for specialized needs, such as hemophilia or other clotting disorders. BEFORE THE TRANSFUSION  Who gives blood for transfusions?   Healthy volunteers who are fully evaluated to make sure their blood is safe. This is blood bank blood. Transfusion therapy is the safest it has ever been in the practice of medicine. Before blood is taken from a donor, a complete history is taken to make sure that person has no history of diseases nor engages in risky social behavior (examples are  intravenous drug use or sexual activity with multiple partners). The donor's travel history is screened to minimize risk of transmitting infections, such as malaria. The donated blood is tested for signs of infectious diseases, such as HIV and hepatitis. The blood is then tested to be sure it is compatible with you in order to minimize the chance of a transfusion reaction. If you or a relative donates blood, this is often done in anticipation of surgery and is not appropriate for emergency situations. It takes many days to process the donated blood. RISKS AND COMPLICATIONS Although transfusion therapy is very safe and saves many lives, the main dangers of transfusion include:   Getting an infectious disease.  Developing a transfusion reaction. This is an allergic reaction to something in the blood you were given. Every precaution is taken to prevent this. The decision to have a blood transfusion has been considered carefully by your caregiver before blood is given. Blood is not given unless the benefits outweigh the risks. AFTER THE TRANSFUSION  Right after receiving a blood transfusion, you will usually feel much better and more energetic. This is especially true if your red blood cells have gotten low (anemic). The transfusion raises the level of the red blood cells which carry oxygen, and this usually causes an energy increase.  The nurse administering the transfusion will  monitor you carefully for complications. HOME CARE INSTRUCTIONS  No special instructions are needed after a transfusion. You may find your energy is better. Speak with your caregiver about any limitations on activity for underlying diseases you may have. SEEK MEDICAL CARE IF:   Your condition is not improving after your transfusion.  You develop redness or irritation at the intravenous (IV) site. SEEK IMMEDIATE MEDICAL CARE IF:  Any of the following symptoms occur over the next 12 hours:  Shaking chills.  You have a  temperature by mouth above 102 F (38.9 C), not controlled by medicine.  Chest, back, or muscle pain.  People around you feel you are not acting correctly or are confused.  Shortness of breath or difficulty breathing.  Dizziness and fainting.  You get a rash or develop hives.  You have a decrease in urine output.  Your urine turns a dark color or changes to pink, red, or brown. Any of the following symptoms occur over the next 10 days:  You have a temperature by mouth above 102 F (38.9 C), not controlled by medicine.  Shortness of breath.  Weakness after normal activity.  The white part of the eye turns yellow (jaundice).  You have a decrease in the amount of urine or are urinating less often.  Your urine turns a dark color or changes to pink, red, or brown. Document Released: 09/04/2000 Document Revised: 11/30/2011 Document Reviewed: 04/23/2008 [facility] Patient Information 2014 [place], [place].  _______________________________________________________________________

## 2017-05-21 NOTE — Progress Notes (Signed)
04-28-17 EKG on chart  04-19-17 Surgical clearance on chart

## 2017-05-26 ENCOUNTER — Encounter (HOSPITAL_COMMUNITY): Payer: Self-pay

## 2017-05-26 ENCOUNTER — Encounter (HOSPITAL_COMMUNITY)
Admission: RE | Admit: 2017-05-26 | Discharge: 2017-05-26 | Disposition: A | Discharge status: Home / Self Care | Payer: 59 | Source: Ambulatory Visit | Attending: Orthopedic Surgery | Admitting: Orthopedic Surgery

## 2017-05-26 DIAGNOSIS — Z01818 Encounter for other preprocedural examination: Secondary | ICD-10-CM | POA: Insufficient documentation

## 2017-05-26 DIAGNOSIS — M13851 Other specified arthritis, right hip: Secondary | ICD-10-CM | POA: Diagnosis not present

## 2017-05-26 LAB — CBC
HEMATOCRIT: 40.1 % (ref 39.0–52.0)
Hemoglobin: 14 g/dL (ref 13.0–17.0)
MCH: 33 pg (ref 26.0–34.0)
MCHC: 34.9 g/dL (ref 30.0–36.0)
MCV: 94.6 fL (ref 78.0–100.0)
PLATELETS: 231 10*3/uL (ref 150–400)
RBC: 4.24 MIL/uL (ref 4.22–5.81)
RDW: 13.2 % (ref 11.5–15.5)
WBC: 3.6 10*3/uL — ABNORMAL LOW (ref 4.0–10.5)

## 2017-05-26 LAB — SURGICAL PCR SCREEN
MRSA, PCR: NEGATIVE
Staphylococcus aureus: POSITIVE — AB

## 2017-05-26 LAB — ABO/RH: ABO/RH(D): O POS

## 2017-06-02 NOTE — Anesthesia Preprocedure Evaluation (Addendum)
Anesthesia Evaluation  Patient identified by MRN, date of birth, ID band Patient awake    Reviewed: Allergy & Precautions, NPO status , Patient's Chart, lab work & pertinent test results  Airway Mallampati: II  TM Distance: >3 FB Neck ROM: Full    Dental  (+) Dental Advisory Given   Pulmonary former smoker,    breath sounds clear to auscultation       Cardiovascular negative cardio ROS   Rhythm:Regular Rate:Normal     Neuro/Psych negative neurological ROS     GI/Hepatic negative GI ROS, Neg liver ROS,   Endo/Other  negative endocrine ROS  Renal/GU negative Renal ROS     Musculoskeletal  (+) Arthritis ,   Abdominal   Peds  Hematology negative hematology ROS (+)   Anesthesia Other Findings   Reproductive/Obstetrics                            Lab Results  Component Value Date   WBC 3.6 (L) 05/26/2017   HGB 14.0 05/26/2017   HCT 40.1 05/26/2017   MCV 94.6 05/26/2017   PLT 231 05/26/2017   Lab Results  Component Value Date   CREATININE 0.95 02/25/2017   BUN 11 02/25/2017   NA 137 02/25/2017   K 4.8 02/25/2017   CL 104 02/25/2017   CO2 25 02/25/2017    Anesthesia Physical Anesthesia Plan  ASA: I  Anesthesia Plan: Spinal   Post-op Pain Management:    Induction: Intravenous  PONV Risk Score and Plan: 2 and Ondansetron, Dexamethasone and Treatment may vary due to age or medical condition  Airway Management Planned: Natural Airway and Simple Face Mask  Additional Equipment:   Intra-op Plan:   Post-operative Plan:   Informed Consent: I have reviewed the patients History and Physical, chart, labs and discussed the procedure including the risks, benefits and alternatives for the proposed anesthesia with the patient or authorized representative who has indicated his/her understanding and acceptance.   Dental advisory given  Plan Discussed with: CRNA  Anesthesia Plan  Comments:        Anesthesia Quick Evaluation

## 2017-06-03 ENCOUNTER — Inpatient Hospital Stay (HOSPITAL_COMMUNITY): Payer: 59

## 2017-06-03 ENCOUNTER — Encounter (HOSPITAL_COMMUNITY): Payer: Self-pay | Admitting: *Deleted

## 2017-06-03 ENCOUNTER — Inpatient Hospital Stay (HOSPITAL_COMMUNITY): Payer: 59 | Admitting: Anesthesiology

## 2017-06-03 ENCOUNTER — Encounter (HOSPITAL_COMMUNITY)
Admission: RE | Disposition: A | Discharge status: Home / Self Care | Payer: Self-pay | Source: Ambulatory Visit | Attending: Orthopedic Surgery

## 2017-06-03 ENCOUNTER — Inpatient Hospital Stay (HOSPITAL_COMMUNITY)
Admission: RE | Admit: 2017-06-03 | Discharge: 2017-06-04 | DRG: 470 | Disposition: A | Discharge status: Home / Self Care | Payer: 59 | Source: Ambulatory Visit | Attending: Orthopedic Surgery | Admitting: Orthopedic Surgery

## 2017-06-03 DIAGNOSIS — Z87891 Personal history of nicotine dependence: Secondary | ICD-10-CM | POA: Diagnosis not present

## 2017-06-03 DIAGNOSIS — B1910 Unspecified viral hepatitis B without hepatic coma: Secondary | ICD-10-CM | POA: Diagnosis present

## 2017-06-03 DIAGNOSIS — M1611 Unilateral primary osteoarthritis, right hip: Principal | ICD-10-CM | POA: Diagnosis present

## 2017-06-03 DIAGNOSIS — Z419 Encounter for procedure for purposes other than remedying health state, unspecified: Secondary | ICD-10-CM

## 2017-06-03 DIAGNOSIS — Z09 Encounter for follow-up examination after completed treatment for conditions other than malignant neoplasm: Secondary | ICD-10-CM

## 2017-06-03 HISTORY — PX: TOTAL HIP ARTHROPLASTY: SHX124

## 2017-06-03 LAB — TYPE AND SCREEN
ABO/RH(D): O POS
ANTIBODY SCREEN: NEGATIVE

## 2017-06-03 SURGERY — ARTHROPLASTY, HIP, TOTAL, ANTERIOR APPROACH
Anesthesia: Spinal | Site: Hip | Laterality: Right

## 2017-06-03 MED ORDER — WATER FOR IRRIGATION, STERILE IR SOLN
Status: DC | PRN
  Administered 2017-06-03: 2000 mL

## 2017-06-03 MED ORDER — PROPOFOL 10 MG/ML IV BOLUS
INTRAVENOUS | Status: AC
  Filled 2017-06-03: qty 20

## 2017-06-03 MED ORDER — ONDANSETRON HCL 4 MG/2ML IJ SOLN: 4.0000 mg | Freq: Four times a day (QID) | INTRAMUSCULAR | Status: DC | PRN

## 2017-06-03 MED ORDER — HYDROMORPHONE HCL-NACL 0.5-0.9 MG/ML-% IV SOSY: 0.2500 mg | PREFILLED_SYRINGE | INTRAVENOUS | Status: DC | PRN

## 2017-06-03 MED ORDER — HYDROMORPHONE HCL-NACL 0.5-0.9 MG/ML-% IV SOSY: 0.5000 mg | PREFILLED_SYRINGE | INTRAVENOUS | Status: DC | PRN

## 2017-06-03 MED ORDER — SODIUM CHLORIDE 0.9 % IR SOLN
Status: DC | PRN
  Administered 2017-06-03: 3000 mL

## 2017-06-03 MED ORDER — CHLORHEXIDINE GLUCONATE 4 % EX LIQD: 60.0000 mL | Freq: Once | CUTANEOUS | Status: DC

## 2017-06-03 MED ORDER — 0.9 % SODIUM CHLORIDE (POUR BTL) OPTIME
TOPICAL | Status: DC | PRN
  Administered 2017-06-03: 1000 mL

## 2017-06-03 MED ORDER — LIDOCAINE 2% (20 MG/ML) 5 ML SYRINGE
INTRAMUSCULAR | Status: AC
  Filled 2017-06-03: qty 5

## 2017-06-03 MED ORDER — DEXAMETHASONE SODIUM PHOSPHATE 10 MG/ML IJ SOLN
INTRAMUSCULAR | Status: DC | PRN
  Administered 2017-06-03: 10 mg via INTRAVENOUS

## 2017-06-03 MED ORDER — SODIUM CHLORIDE 0.9 % IV SOLN: INTRAVENOUS | Status: DC

## 2017-06-03 MED ORDER — BUPIVACAINE HCL (PF) 0.5 % IJ SOLN
INTRAMUSCULAR | Status: AC
  Filled 2017-06-03: qty 30

## 2017-06-03 MED ORDER — POVIDONE-IODINE 10 % EX SWAB: 2.0000 "application " | Freq: Once | CUTANEOUS | Status: DC

## 2017-06-03 MED ORDER — ACETAMINOPHEN 650 MG RE SUPP: 650.0000 mg | Freq: Four times a day (QID) | RECTAL | Status: DC | PRN

## 2017-06-03 MED ORDER — KETOROLAC TROMETHAMINE 15 MG/ML IJ SOLN
15.0000 mg | Freq: Four times a day (QID) | INTRAMUSCULAR | Status: AC
  Administered 2017-06-03 – 2017-06-04 (×4): 15 mg via INTRAVENOUS
  Filled 2017-06-03 (×4): qty 1

## 2017-06-03 MED ORDER — SODIUM CHLORIDE 0.9 % IJ SOLN
INTRAMUSCULAR | Status: DC | PRN
  Administered 2017-06-03: 30 mL

## 2017-06-03 MED ORDER — TRANEXAMIC ACID 1000 MG/10ML IV SOLN
1000.0000 mg | Freq: Once | INTRAVENOUS | Status: AC
  Administered 2017-06-03: 12:00:00 1000 mg via INTRAVENOUS
  Filled 2017-06-03: qty 1100

## 2017-06-03 MED ORDER — DEXAMETHASONE SODIUM PHOSPHATE 10 MG/ML IJ SOLN
10.0000 mg | Freq: Once | INTRAMUSCULAR | Status: AC
Start: 2017-06-04 — End: 2017-06-04
  Administered 2017-06-04: 10:00:00 10 mg via INTRAVENOUS
  Filled 2017-06-03: qty 1

## 2017-06-03 MED ORDER — MENTHOL 3 MG MT LOZG: 1.0000 | LOZENGE | OROMUCOSAL | Status: DC | PRN

## 2017-06-03 MED ORDER — ONDANSETRON HCL 4 MG/2ML IJ SOLN
INTRAMUSCULAR | Status: DC | PRN
  Administered 2017-06-03: 4 mg via INTRAVENOUS

## 2017-06-03 MED ORDER — ACETAMINOPHEN 10 MG/ML IV SOLN
INTRAVENOUS | Status: AC
  Filled 2017-06-03: qty 100

## 2017-06-03 MED ORDER — LIDOCAINE 2% (20 MG/ML) 5 ML SYRINGE
INTRAMUSCULAR | Status: DC | PRN
  Administered 2017-06-03: 40 mg via INTRAVENOUS

## 2017-06-03 MED ORDER — DOCUSATE SODIUM 100 MG PO CAPS
100.0000 mg | ORAL_CAPSULE | Freq: Two times a day (BID) | ORAL | Status: DC
  Administered 2017-06-03 – 2017-06-04 (×2): 100 mg via ORAL
  Filled 2017-06-03 (×2): qty 1

## 2017-06-03 MED ORDER — SENNA 8.6 MG PO TABS
2.0000 | ORAL_TABLET | Freq: Every day | ORAL | Status: DC
  Administered 2017-06-03: 22:00:00 17.2 mg via ORAL
  Filled 2017-06-03: qty 2

## 2017-06-03 MED ORDER — BUPIVACAINE HCL (PF) 0.5 % IJ SOLN
INTRAMUSCULAR | Status: DC | PRN
  Administered 2017-06-03: 3 mL via INTRATHECAL

## 2017-06-03 MED ORDER — TRANEXAMIC ACID 1000 MG/10ML IV SOLN
1000.0000 mg | INTRAVENOUS | Status: AC
  Administered 2017-06-03: 1000 mg via INTRAVENOUS
  Filled 2017-06-03: qty 1100

## 2017-06-03 MED ORDER — ACETAMINOPHEN 325 MG PO TABS: 650.0000 mg | ORAL_TABLET | Freq: Four times a day (QID) | ORAL | Status: DC | PRN

## 2017-06-03 MED ORDER — FENTANYL CITRATE (PF) 100 MCG/2ML IJ SOLN
INTRAMUSCULAR | Status: AC
  Filled 2017-06-03: qty 2

## 2017-06-03 MED ORDER — PHENOL 1.4 % MT LIQD: 1.0000 | OROMUCOSAL | Status: DC | PRN

## 2017-06-03 MED ORDER — CEFAZOLIN SODIUM-DEXTROSE 2-4 GM/100ML-% IV SOLN
2.0000 g | INTRAVENOUS | Status: AC
  Administered 2017-06-03: 2 g via INTRAVENOUS

## 2017-06-03 MED ORDER — METHOCARBAMOL 1000 MG/10ML IJ SOLN
500.0000 mg | Freq: Four times a day (QID) | INTRAMUSCULAR | Status: DC | PRN
  Administered 2017-06-03: 500 mg via INTRAVENOUS
  Filled 2017-06-03: qty 550

## 2017-06-03 MED ORDER — SODIUM CHLORIDE 0.9 % IJ SOLN
INTRAMUSCULAR | Status: AC
  Filled 2017-06-03: qty 50

## 2017-06-03 MED ORDER — ACETAMINOPHEN 10 MG/ML IV SOLN
1000.0000 mg | INTRAVENOUS | Status: AC
  Administered 2017-06-03: 1000 mg via INTRAVENOUS

## 2017-06-03 MED ORDER — MIDAZOLAM HCL 2 MG/2ML IJ SOLN
INTRAMUSCULAR | Status: AC
  Filled 2017-06-03: qty 2

## 2017-06-03 MED ORDER — ASPIRIN 81 MG PO CHEW
81.0000 mg | CHEWABLE_TABLET | Freq: Two times a day (BID) | ORAL | Status: DC
  Administered 2017-06-03 – 2017-06-04 (×2): 81 mg via ORAL
  Filled 2017-06-03 (×2): qty 1

## 2017-06-03 MED ORDER — HYDROCODONE-ACETAMINOPHEN 5-325 MG PO TABS
1.0000 | ORAL_TABLET | ORAL | Status: DC | PRN
  Administered 2017-06-03 – 2017-06-04 (×3): 1 via ORAL
  Filled 2017-06-03: qty 2
  Filled 2017-06-03 (×2): qty 1

## 2017-06-03 MED ORDER — MIDAZOLAM HCL 2 MG/2ML IJ SOLN
INTRAMUSCULAR | Status: DC | PRN
  Administered 2017-06-03: 2 mg via INTRAVENOUS

## 2017-06-03 MED ORDER — POLYETHYLENE GLYCOL 3350 17 G PO PACK: 17.0000 g | PACK | Freq: Every day | ORAL | Status: DC | PRN

## 2017-06-03 MED ORDER — DEXAMETHASONE SODIUM PHOSPHATE 10 MG/ML IJ SOLN
INTRAMUSCULAR | Status: AC
  Filled 2017-06-03: qty 1

## 2017-06-03 MED ORDER — SODIUM CHLORIDE 0.9 % IV SOLN
INTRAVENOUS | Status: DC
  Administered 2017-06-03: 150 mL/h via INTRAVENOUS

## 2017-06-03 MED ORDER — BUPIVACAINE-EPINEPHRINE 0.25% -1:200000 IJ SOLN
INTRAMUSCULAR | Status: DC | PRN
  Administered 2017-06-03: 30 mL

## 2017-06-03 MED ORDER — CEFAZOLIN SODIUM-DEXTROSE 2-4 GM/100ML-% IV SOLN
2.0000 g | Freq: Four times a day (QID) | INTRAVENOUS | Status: AC
  Administered 2017-06-03 (×2): 2 g via INTRAVENOUS
  Filled 2017-06-03 (×2): qty 100

## 2017-06-03 MED ORDER — DIPHENHYDRAMINE HCL 12.5 MG/5ML PO ELIX: 12.5000 mg | ORAL_SOLUTION | ORAL | Status: DC | PRN

## 2017-06-03 MED ORDER — ISOPROPYL ALCOHOL 70 % SOLN
Status: DC | PRN
  Administered 2017-06-03: 1 via TOPICAL

## 2017-06-03 MED ORDER — METHOCARBAMOL 500 MG PO TABS
500.0000 mg | ORAL_TABLET | Freq: Four times a day (QID) | ORAL | Status: DC | PRN
  Administered 2017-06-03 – 2017-06-04 (×2): 500 mg via ORAL
  Filled 2017-06-03 (×2): qty 1

## 2017-06-03 MED ORDER — SODIUM CHLORIDE 0.9 % IR SOLN
Status: DC | PRN
  Administered 2017-06-03: 1

## 2017-06-03 MED ORDER — KETOROLAC TROMETHAMINE 30 MG/ML IJ SOLN
INTRAMUSCULAR | Status: DC | PRN
  Administered 2017-06-03: 30 mg

## 2017-06-03 MED ORDER — PROPOFOL 10 MG/ML IV BOLUS
INTRAVENOUS | Status: AC
  Filled 2017-06-03: qty 60

## 2017-06-03 MED ORDER — METOCLOPRAMIDE HCL 5 MG PO TABS: 5.0000 mg | ORAL_TABLET | Freq: Three times a day (TID) | ORAL | Status: DC | PRN

## 2017-06-03 MED ORDER — BUPIVACAINE-EPINEPHRINE (PF) 0.25% -1:200000 IJ SOLN
INTRAMUSCULAR | Status: AC
  Filled 2017-06-03: qty 30

## 2017-06-03 MED ORDER — FENTANYL CITRATE (PF) 100 MCG/2ML IJ SOLN
INTRAMUSCULAR | Status: DC | PRN
  Administered 2017-06-03 (×2): 50 ug via INTRAVENOUS

## 2017-06-03 MED ORDER — PROPOFOL 500 MG/50ML IV EMUL
INTRAVENOUS | Status: DC | PRN
  Administered 2017-06-03: 75 ug/kg/min via INTRAVENOUS

## 2017-06-03 MED ORDER — PROMETHAZINE HCL 25 MG/ML IJ SOLN: 6.2500 mg | INTRAMUSCULAR | Status: DC | PRN

## 2017-06-03 MED ORDER — ONDANSETRON HCL 4 MG PO TABS: 4.0000 mg | ORAL_TABLET | Freq: Four times a day (QID) | ORAL | Status: DC | PRN

## 2017-06-03 MED ORDER — CEFAZOLIN SODIUM-DEXTROSE 2-4 GM/100ML-% IV SOLN
INTRAVENOUS | Status: AC
  Filled 2017-06-03: qty 100

## 2017-06-03 MED ORDER — ONDANSETRON HCL 4 MG/2ML IJ SOLN
INTRAMUSCULAR | Status: AC
  Filled 2017-06-03: qty 2

## 2017-06-03 MED ORDER — METOCLOPRAMIDE HCL 5 MG/ML IJ SOLN: 5.0000 mg | Freq: Three times a day (TID) | INTRAMUSCULAR | Status: DC | PRN

## 2017-06-03 MED ORDER — LACTATED RINGERS IV SOLN
INTRAVENOUS | Status: DC | PRN
  Administered 2017-06-03 (×2): via INTRAVENOUS

## 2017-06-03 MED ORDER — KETOROLAC TROMETHAMINE 30 MG/ML IJ SOLN
INTRAMUSCULAR | Status: AC
  Filled 2017-06-03: qty 1

## 2017-06-03 SURGICAL SUPPLY — 45 items
BAG DECANTER FOR FLEXI CONT (MISCELLANEOUS) IMPLANT
BAG ZIPLOCK 12X15 (MISCELLANEOUS) IMPLANT
CAPT HIP TOTAL 2 ×3 IMPLANT
CHLORAPREP W/TINT 26ML (MISCELLANEOUS) ×3 IMPLANT
CLOTH BEACON ORANGE TIMEOUT ST (SAFETY) ×3 IMPLANT
COVER PERINEAL POST (MISCELLANEOUS) ×3 IMPLANT
COVER SURGICAL LIGHT HANDLE (MISCELLANEOUS) ×3 IMPLANT
DECANTER SPIKE VIAL GLASS SM (MISCELLANEOUS) ×3 IMPLANT
DERMABOND ADVANCED (GAUZE/BANDAGES/DRESSINGS) ×2
DERMABOND ADVANCED .7 DNX12 (GAUZE/BANDAGES/DRESSINGS) ×1 IMPLANT
DRAPE SHEET LG 3/4 BI-LAMINATE (DRAPES) ×9 IMPLANT
DRAPE STERI IOBAN 125X83 (DRAPES) ×3 IMPLANT
DRAPE U-SHAPE 47X51 STRL (DRAPES) ×6 IMPLANT
DRESSING AQUACEL AG SP 3.5X6 (GAUZE/BANDAGES/DRESSINGS) ×1 IMPLANT
DRSG AQUACEL AG ADV 3.5X10 (GAUZE/BANDAGES/DRESSINGS) ×3 IMPLANT
DRSG AQUACEL AG SP 3.5X6 (GAUZE/BANDAGES/DRESSINGS) ×3
ELECT PENCIL ROCKER SW 15FT (MISCELLANEOUS) ×3 IMPLANT
ELECT REM PT RETURN 15FT ADLT (MISCELLANEOUS) ×3 IMPLANT
GAUZE SPONGE 4X4 12PLY STRL (GAUZE/BANDAGES/DRESSINGS) ×3 IMPLANT
GLOVE BIO SURGEON STRL SZ8.5 (GLOVE) ×6 IMPLANT
GLOVE BIOGEL PI IND STRL 8.5 (GLOVE) ×1 IMPLANT
GLOVE BIOGEL PI INDICATOR 8.5 (GLOVE) ×2
GOWN SPEC L3 XXLG W/TWL (GOWN DISPOSABLE) ×3 IMPLANT
HANDPIECE INTERPULSE COAX TIP (DISPOSABLE) ×2
HOLDER FOLEY CATH W/STRAP (MISCELLANEOUS) ×3 IMPLANT
HOOD PEEL AWAY FLYTE STAYCOOL (MISCELLANEOUS) ×6 IMPLANT
MARKER SKIN DUAL TIP RULER LAB (MISCELLANEOUS) ×3 IMPLANT
NEEDLE SPNL 18GX3.5 QUINCKE PK (NEEDLE) ×3 IMPLANT
PACK ANTERIOR HIP CUSTOM (KITS) ×3 IMPLANT
REAMER ROD DEEP FLUTE 2.5X950 (INSTRUMENTS) ×3 IMPLANT
SAW OSC TIP CART 19.5X105X1.3 (SAW) ×3 IMPLANT
SEALER BIPOLAR AQUA 6.0 (INSTRUMENTS) ×3 IMPLANT
SET HNDPC FAN SPRY TIP SCT (DISPOSABLE) ×1 IMPLANT
SUT ETHIBOND NAB CT1 #1 30IN (SUTURE) ×6 IMPLANT
SUT MNCRL AB 3-0 PS2 18 (SUTURE) ×3 IMPLANT
SUT MON AB 2-0 CT1 36 (SUTURE) ×6 IMPLANT
SUT STRATAFIX PDO 1 14 VIOLET (SUTURE) ×2
SUT STRATFX PDO 1 14 VIOLET (SUTURE) ×1
SUT VIC AB 2-0 CT1 27 (SUTURE) ×2
SUT VIC AB 2-0 CT1 TAPERPNT 27 (SUTURE) ×1 IMPLANT
SUTURE STRATFX PDO 1 14 VIOLET (SUTURE) ×1 IMPLANT
SYR 50ML LL SCALE MARK (SYRINGE) ×3 IMPLANT
TRAY FOLEY W/METER SILVER 16FR (SET/KITS/TRAYS/PACK) IMPLANT
WATER STERILE IRR 1500ML POUR (IV SOLUTION) ×3 IMPLANT
YANKAUER SUCT BULB TIP 10FT TU (MISCELLANEOUS) ×3 IMPLANT

## 2017-06-03 NOTE — Evaluation (Signed)
Physical Therapy Evaluation Patient Details Name: James Arroyo MRN: [id number] DOB: [birth year] Today's Date: 06/03/2017   History of Present Illness  Pt s/p R THR  Clinical Impression  Pt s/p R THR and presents with decreased R LE strength/ROM and post op pain limiting functional mobility.  Pt should progress to dc home with family assist.    Follow Up Recommendations DC plan and follow up therapy as arranged by surgeon    Equipment Recommendations  None recommended by PT    Recommendations for Other Services       Precautions / Restrictions Precautions Precautions: Fall Restrictions Weight Bearing Restrictions: No Other Position/Activity Restrictions: WBAT      Mobility  Bed Mobility Overal bed mobility: Needs Assistance Bed Mobility: Supine to Sit     Supine to sit: Min assist     General bed mobility comments: cues for sequence and use of L LE to self assist  Transfers Overall transfer level: Needs assistance Equipment used: Rolling walker (2 wheeled) Transfers: Sit to/from Stand Sit to Stand: Min assist         General transfer comment: cues for LE management and use of UEs to self assist  Ambulation/Gait Ambulation/Gait assistance: Min assist Ambulation Distance (Feet): 75 Feet Assistive device: Rolling walker (2 wheeled) Gait Pattern/deviations: Step-to pattern;Step-through pattern;Decreased step length - right;Decreased step length - left;Shuffle;Trunk flexed Gait velocity: decr Gait velocity interpretation: Below normal speed for age/gender General Gait Details: cues for sequence, posture and position from [employer]            Wheelchair Mobility    Modified Rankin (Stroke Patients Only)       Balance Overall balance assessment: No apparent balance deficits (not formally assessed)                                           Pertinent Vitals/Pain Pain Assessment: 0-10 Pain Score: 2  Pain Location: R  hip Pain Descriptors / Indicators: Aching;Sore Pain Intervention(s): Limited activity within patient's tolerance;Monitored during session;Premedicated before session;Ice applied    Home Living Family/patient expects to be discharged to:: Private residence Living Arrangements: Spouse/significant other Available Help at Discharge: Family Type of Home: House Home Access: Stairs to enter Entrance Stairs-Rails: Right Entrance Stairs-Number of Steps: 4 Home Layout: Two level Home Equipment: Environmental consultant - 2 wheels;Cane - single point      Prior Function Level of Independence: Independent               Hand Dominance        Extremity/Trunk Assessment   Upper Extremity Assessment Upper Extremity Assessment: Overall WFL for tasks assessed    Lower Extremity Assessment Lower Extremity Assessment: RLE deficits/detail    Cervical / Trunk Assessment Cervical / Trunk Assessment: Normal  Communication   Communication: No difficulties  Cognition Arousal/Alertness: Awake/alert Behavior During Therapy: WFL for tasks assessed/performed Overall Cognitive Status: Within Functional Limits for tasks assessed                                        General Comments      Exercises     Assessment/Plan    PT Assessment Patient needs continued PT services  PT Problem List Decreased strength;Decreased range of motion;Decreased activity tolerance;Decreased mobility;Decreased knowledge of use of DME;Pain  PT Treatment Interventions DME instruction;Gait training;Stair training;Functional mobility training;Therapeutic activities;Therapeutic exercise;Patient/family education    PT Goals (Current goals can be found in the Care Plan section)  Acute Rehab PT Goals Patient Stated Goal: Regain IND and be back for the other side PT Goal Formulation: With patient Time For Goal Achievement: 06/05/17 Potential to Achieve Goals: Good    Frequency 7X/week   Barriers to  discharge        Co-evaluation               AM-PAC PT "6 Clicks" Daily Activity  Outcome Measure Difficulty turning over in bed (including adjusting bedclothes, sheets and blankets)?: Unable Difficulty moving from lying on back to sitting on the side of the bed? : Unable Difficulty sitting down on and standing up from a chair with arms (e.g., wheelchair, bedside commode, etc,.)?: Unable Help needed moving to and from a bed to chair (including a wheelchair)?: A Little Help needed walking in hospital room?: A Little Help needed climbing 3-5 steps with a railing? : A Little 6 Click Score: 12    End of Session Equipment Utilized During Treatment: Gait belt Activity Tolerance: Patient tolerated treatment well Patient left: in chair;with call bell/phone within reach;with family/visitor present Nurse Communication: Mobility status PT Visit Diagnosis: Unsteadiness on feet (R26.81);Difficulty in walking, not elsewhere classified (R26.2)    Time: 3545-6256 PT Time Calculation (min) (ACUTE ONLY): 28 min   Charges:   PT Evaluation $PT Eval Low Complexity: 1 Low PT Treatments $Gait Training: 8-22 mins   PT G Codes:        Pg [phone]   Liza Czerwinski 06/03/2017, 4:44 PM

## 2017-06-03 NOTE — Anesthesia Procedure Notes (Signed)
Procedure Name: MAC Date/Time: 06/03/2017 7:52 AM Performed by: Dione Booze Pre-anesthesia Checklist: Patient identified, Emergency Drugs available, Suction available and Patient being monitored Patient Re-evaluated:Patient Re-evaluated prior to induction Oxygen Delivery Method: Simple face mask Placement Confirmation: positive ETCO2

## 2017-06-03 NOTE — Discharge Instructions (Signed)
°Dr. Amiel Sharrow °Joint Replacement Specialist °[facility] °[address] °, [place] [address] °[phone] ° ° °TOTAL HIP REPLACEMENT POSTOPERATIVE DIRECTIONS ° ° ° °Hip Rehabilitation, Guidelines Following Surgery  ° °WEIGHT BEARING °Weight bearing as tolerated with assist device (walker, cane, etc) as directed, use it as long as suggested by your surgeon or therapist, typically at least 4-6 weeks. ° °The results of a hip operation are greatly improved after range of motion and muscle strengthening exercises. Follow all safety measures which are given to protect your hip. If any of these exercises cause increased pain or swelling in your joint, decrease the amount until you are comfortable again. Then slowly increase the exercises. Call your caregiver if you have problems or questions.  ° °HOME CARE INSTRUCTIONS  °Most of the following instructions are designed to prevent the dislocation of your new hip.  °Remove items at home which could result in a fall. This includes throw rugs or furniture in walking pathways.  °Continue medications as instructed at time of discharge. °· You may have some home medications which will be placed on hold until you complete the course of blood thinner medication. °· You may start showering once you are discharged home. Do not remove your dressing. °Do not put on socks or shoes without following the instructions of your caregivers.   °Sit on chairs with arms. Use the chair arms to help push yourself up when arising.  °Arrange for the use of a toilet seat elevator so you are not sitting low.  °· Walk with walker as instructed.  °You may resume a sexual relationship in one month or when given the OK by your caregiver.  °Use walker as long as suggested by your caregivers.  °You may put full weight on your legs and walk as much as is comfortable. °Avoid periods of inactivity such as sitting longer than an hour when not asleep. This helps prevent  blood clots.  °You may return to work once you are cleared by your surgeon.  °Do not drive a car for 6 weeks or until released by your surgeon.  °Do not drive while taking narcotics.  °Wear elastic stockings for two weeks following surgery during the day but you may remove then at night.  °Make sure you keep all of your appointments after your operation with all of your doctors and caregivers. You should call the office at the above phone number and make an appointment for approximately two weeks after the date of your surgery. °Please pick up a stool softener and laxative for home use as long as you are requiring pain medications. °· ICE to the affected hip every three hours for 30 minutes at a time and then as needed for pain and swelling. Continue to use ice on the hip for pain and swelling from surgery. You may notice swelling that will progress down to the foot and ankle.  This is normal after surgery.  Elevate the leg when you are not up walking on it.   °It is important for you to complete the blood thinner medication as prescribed by your doctor. °· Continue to use the breathing machine which will help keep your temperature down.  It is common for your temperature to cycle up and down following surgery, especially at night when you are not up moving around and exerting yourself.  The breathing machine keeps your lungs expanded and your temperature down. ° °RANGE OF MOTION AND STRENGTHENING EXERCISES  °These exercises are   designed to help you keep full movement of your hip joint. Follow your caregiver's or physical therapist's instructions. Perform all exercises about fifteen times, three times per day or as directed. Exercise both hips, even if you have had only one joint replacement. These exercises can be done on a training (exercise) mat, on the floor, on a table or on a bed. Use whatever works the best and is most comfortable for you. Use music or television while you are exercising so that the exercises  are a pleasant break in your day. This will make your life better with the exercises acting as a break in routine you can look forward to.  °Lying on your back, slowly slide your foot toward your buttocks, raising your knee up off the floor. Then slowly slide your foot back down until your leg is straight again.  °Lying on your back spread your legs as far apart as you can without causing discomfort.  °Lying on your side, raise your upper leg and foot straight up from the floor as far as is comfortable. Slowly lower the leg and repeat.  °Lying on your back, tighten up the muscle in the front of your thigh (quadriceps muscles). You can do this by keeping your leg straight and trying to raise your heel off the floor. This helps strengthen the largest muscle supporting your knee.  °Lying on your back, tighten up the muscles of your buttocks both with the legs straight and with the knee bent at a comfortable angle while keeping your heel on the floor.  ° °SKILLED REHAB INSTRUCTIONS: °If the patient is transferred to a skilled rehab facility following release from the hospital, a list of the current medications will be sent to the facility for the patient to continue.  When discharged from the skilled rehab facility, please have the facility set up the patient's Home Health Physical Therapy prior to being released. Also, the skilled facility will be responsible for providing the patient with their medications at time of release from the facility to include their pain medication and their blood thinner medication. If the patient is still at the rehab facility at time of the two week follow up appointment, the skilled rehab facility will also need to assist the patient in arranging follow up appointment in our office and any transportation needs. ° °MAKE SURE YOU:  °Understand these instructions.  °Will watch your condition.  °Will get help right away if you are not doing well or get worse. ° °Pick up stool softner and  laxative for home use following surgery while on pain medications. °Do not remove your dressing. °The dressing is waterproof--it is OK to take showers. °Continue to use ice for pain and swelling after surgery. °Do not use any lotions or creams on the incision until instructed by your surgeon. °Total Hip Protocol. ° ° °

## 2017-06-03 NOTE — H&P (View-Only) (Signed)
TOTAL HIP ADMISSION H&P  Patient is admitted for right total hip arthroplasty.  Subjective:  Chief Complaint: right hip pain  HPI: James Arroyo, 56 y.o. male, has a history of pain and functional disability in the right hip(s) due to arthritis and patient has failed non-surgical conservative treatments for greater than 12 weeks to include NSAID's and/or analgesics, flexibility and strengthening excercises, supervised PT with diminished ADL's post treatment, use of assistive devices and activity modification.  Onset of symptoms was gradual starting 1 years ago with rapidlly worsening course since that time.The patient noted no past surgery on the right hip(s).  Patient currently rates pain in the right hip at 10 out of 10 with activity. Patient has night pain, worsening of pain with activity and weight bearing, pain that interfers with activities of daily living, pain with passive range of motion and crepitus. Patient has evidence of subchondral cysts, subchondral sclerosis, periarticular osteophytes and joint space narrowing by imaging studies. This condition presents safety issues increasing the risk of falls. There is no current active infection.  Patient Active Problem List   Diagnosis Date Noted  . Alcohol dependence ([place]) 09/09/2015  . Hepatitis B 07/30/2015  . Hemorrhoids 07/30/2015   Past Medical History:  Diagnosis Date  . Hemorrhoids     Past Surgical History:  Procedure Laterality Date  . cyst eyelids    . TONSILLECTOMY       (Not in a hospital admission) No Known Allergies  Social History  Substance Use Topics  . Smoking status: Former Research scientist (life sciences)  . Smokeless tobacco: Never Used  . Alcohol use No     Comment: Recovering alcoholic    Family History  Problem Relation Age of Onset  . Cancer Mother   . Kidney disease Father   . Hypertension Father   . Colon cancer Neg Hx   . Pancreatic cancer Neg Hx   . Stomach cancer Neg Hx      Review of Systems  Constitutional:  Negative.   HENT: Positive for tinnitus.   Eyes: Negative.   Respiratory: Negative.   Cardiovascular: Positive for leg swelling.  Gastrointestinal: Positive for heartburn.  Genitourinary: Negative.   Musculoskeletal: Positive for back pain and joint pain.  Skin: Negative.   Neurological: Negative.   Endo/Heme/Allergies: Negative.   Psychiatric/Behavioral: Negative.     Objective:  Physical Exam  Vitals reviewed. Constitutional: He is oriented to person, place, and time. He appears well-developed and well-nourished.  HENT:  Head: Normocephalic and atraumatic.  Eyes: Pupils are equal, round, and reactive to light. Conjunctivae and EOM are normal.  Neck: Normal range of motion. Neck supple.  Cardiovascular: Normal rate, regular rhythm and intact distal pulses.   Respiratory: Effort normal. No respiratory distress.  GI: Soft. He exhibits no distension.  Genitourinary:  Genitourinary Comments: deferred  Musculoskeletal:       Right hip: He exhibits decreased range of motion, bony tenderness and crepitus.  Neurological: He is alert and oriented to person, place, and time. He has normal reflexes.  Skin: Skin is warm and dry.  Psychiatric: He has a normal mood and affect. His behavior is normal. Judgment and thought content normal.    Vital signs in last 24 hours: @VSRANGES @  Labs:   Estimated body mass index is 26.59 kg/m as calculated from the following:   Height as of 03/12/16: 5' 6.25" (1.683 m).   Weight as of 03/11/17: 75.3 kg (166 lb).   Imaging Review Plain radiographs demonstrate severe degenerative joint disease of  the right hip(s). The bone quality appears to be adequate for age and reported activity level.  Assessment/Plan:  End stage arthritis, right hip(s)  The patient history, physical examination, clinical judgement of the provider and imaging studies are consistent with end stage degenerative joint disease of the right hip(s) and total hip arthroplasty is  deemed medically necessary. The treatment options including medical management, injection therapy, arthroscopy and arthroplasty were discussed at length. The risks and benefits of total hip arthroplasty were presented and reviewed. The risks due to aseptic loosening, infection, stiffness, dislocation/subluxation,  thromboembolic complications and other imponderables were discussed.  The patient acknowledged the explanation, agreed to proceed with the plan and consent was signed. Patient is being admitted for inpatient treatment for surgery, pain control, PT, OT, prophylactic antibiotics, VTE prophylaxis, progressive ambulation and ADL's and discharge planning.The patient is planning to be discharged home with HEP

## 2017-06-03 NOTE — Anesthesia Postprocedure Evaluation (Signed)
Anesthesia Post Note  Patient: James Arroyo  Procedure(s) Performed: Procedure(s) (LRB): RIGHT TOTAL HIP ARTHROPLASTY ANTERIOR APPROACH (Right)     Patient location during evaluation: PACU Anesthesia Type: Spinal Level of consciousness: awake and alert Pain management: pain level controlled Vital Signs Assessment: post-procedure vital signs reviewed and stable Respiratory status: spontaneous breathing and respiratory function stable Cardiovascular status: blood pressure returned to baseline and stable Postop Assessment: spinal receding Anesthetic complications: no    Last Vitals:  Vitals:   06/03/17 1059 06/03/17 1109  BP: 110/75   Pulse: (!) 53   Resp: 12   Temp: 36.4 C   SpO2: 98% 98%    Last Pain:  Vitals:   06/03/17 1059  TempSrc: Oral  PainSc: 0-No pain                 Tiajuana Amass

## 2017-06-03 NOTE — Anesthesia Procedure Notes (Addendum)
Spinal  Patient location during procedure: OR Start time: 06/03/2017 7:44 AM End time: 06/03/2017 7:49 AM Staffing Anesthesiologist: Suzette Battiest Performed: anesthesiologist  Preanesthetic Checklist Completed: patient identified, site marked, surgical consent, pre-op evaluation, timeout performed, IV checked, risks and benefits discussed and monitors and equipment checked Spinal Block Patient position: sitting Prep: site prepped and draped and DuraPrep Patient monitoring: blood pressure, continuous pulse ox and heart rate Approach: midline Location: L3-4 Injection technique: single-shot Needle Needle type: Pencan  Needle gauge: 24 G Needle length: 9 cm Needle insertion depth: 6 cm

## 2017-06-03 NOTE — Interval H&P Note (Signed)
History and Physical Interval Note:  06/03/2017 7:37 AM  James Arroyo  has presented today for surgery, with the diagnosis of Degenerative joint disease right hip  The various methods of treatment have been discussed with the patient and family. After consideration of risks, benefits and other options for treatment, the patient has consented to  Procedure(s) with comments: RIGHT TOTAL HIP ARTHROPLASTY ANTERIOR APPROACH (Right) - Needs RNFA as a surgical intervention .  The patient's history has been reviewed, patient examined, no change in status, stable for surgery.  I have reviewed the patient's chart and labs.  Questions were answered to the patient's satisfaction.     Ansleigh Safer, Horald Pollen

## 2017-06-03 NOTE — Op Note (Signed)
OPERATIVE REPORT  SURGEON: Rod Can, MD   ASSISTANT: Nehemiah Massed, PA-C.  PREOPERATIVE DIAGNOSIS: Right hip arthritis.   POSTOPERATIVE DIAGNOSIS: Right hip arthritis.   PROCEDURE: Right total hip arthroplasty, anterior approach.   IMPLANTS: DePuy Tri Lock stem, size 5, hi offset. DePuy Pinnacle Cup, size 54 mm. DePuy Altrx liner, size 32 by 54 mm, +4 neutral. DePuy Biolox ceramic head ball, size 32 + 5 mm.  ANESTHESIA:  Spinal  ESTIMATED BLOOD LOSS: 350 mL.  ANTIBIOTICS: 2 g Ancef.  DRAINS: None.  COMPLICATIONS: None.   CONDITION: PACU - hemodynamically stable.   BRIEF CLINICAL NOTE: James Arroyo is a 56 y.o. male with a long-standing history of Right hip arthritis. After failing conservative management, the patient was indicated for total hip arthroplasty. The risks, benefits, and alternatives to the procedure were explained, and the patient elected to proceed.  PROCEDURE IN DETAIL: Surgical site was marked by myself in the pre-op holding area. Once inside the operating room, spinal anesthesia was obtained, and a foley catheter was inserted. The patient was then positioned on the Hana table. All bony prominences were well padded. The hip was prepped and draped in the normal sterile surgical fashion. A time-out was called verifying side and site of surgery. The patient received IV antibiotics within 60 minutes of beginning the procedure.  The direct anterior approach to the hip was performed through the Hueter interval. Lateral femoral circumflex vessels were treated with the Auqumantys. The anterior capsule was exposed and an inverted T capsulotomy was made.The femoral neck cut was made to the level of the templated cut. A corkscrew was placed into the head and the head was removed. The femoral head was found to have eburnated bone. The head was passed to the back table and was measured.  Acetabular exposure was achieved, and the pulvinar and  labrum were excised. Sequential reaming of the acetabulum was then performed up to a size 53 mm reamer. A 54 mm cup was then opened and impacted into place at approximately 40 degrees of abduction and 20 degrees of anteversion. The final polyethylene liner was impacted into place and acetabular osteophytes were removed.   I then gained femoral exposure taking care to protect the abductors and greater trochanter. This was performed using standard external rotation, extension, and adduction. The capsule was peeled off the inner aspect of the greater trochanter, taking care to preserve the short external rotators. A cookie cutter was used to enter the femoral canal, and then the femoral canal finder was placed. Sequential broaching was performed up to a size 4. Calcar planer was used on the femoral neck remnant. I placed a hi offset neck and a trial head ball. The hip was reduced. Leg lengths and offset were checked fluoroscopically. Soft tissue tension was inadequate. The hip was dislocated and trial components were removed. I then attempted to broach up to a size 5.  The broach was clearly engaging the canal distally.  I removed the broach.  I placed a flexible guidewire.  I reamed up to an 11.5 mm reamer.  I then broached with the five broach.  The trial head and neck were inserted and the hip was reduced. Soft tissue tension was appropriate.  On fluoroscopy, the leg length was about 8 mm long. Does demonstrate bone-on-bone arthritis, and we can correct his leg length discrepancy in the future. The final implants were placed, and the hip was reduced.  Fluoroscopy was used to confirm component position and leg lengths.  At 90 degrees of external rotation and full extension, the hip was stable to an anterior directed force.  The wound was copiously irrigated with normal saline using pulse lavage. Marcaine solution was injected into the periarticular soft tissue. The wound was closed in layers using #1  Vicryl and V-Loc for the fascia, 2-0 Vicryl for the subcutaneous fat, 2-0 Monocryl for the deep dermal layer, 3-0 running Monocryl subcuticular stitch, and Dermabond for the skin. Once the glue was fully dried, an Aquacell Ag dressing was applied. The patient was transported to the recovery room in stable condition. Sponge, needle, and instrument counts were correct at the end of the case x2. The patient tolerated the procedure well and there were no known complications.  Please note that a surgical assistant was a medical necessity for this procedure to perform it in a safe and expeditious manner. Assistant was necessary to provide appropriate retraction of vital neurovascular structures, to prevent femoral fracture, and to allow for anatomic placement of the prosthesis.

## 2017-06-03 NOTE — Transfer of Care (Signed)
Immediate Anesthesia Transfer of Care Note  Patient: James Arroyo  Procedure(s) Performed: Procedure(s) with comments: RIGHT TOTAL HIP ARTHROPLASTY ANTERIOR APPROACH (Right) - Needs RNFA  Patient Location: PACU  Anesthesia Type:MAC and Spinal  Level of Consciousness: awake, alert  and patient cooperative  Airway & Oxygen Therapy: Patient Spontanous Breathing and Patient connected to face mask oxygen  Post-op Assessment: Report given to RN and Post -op Vital signs reviewed and stable  Post vital signs: Reviewed and stable  Last Vitals:  Vitals:   06/03/17 0546  BP: 109/74  Pulse: (!) 59  Resp: 18  Temp: 36.7 C  SpO2: 100%    Last Pain:  Vitals:   06/03/17 0554  TempSrc:   PainSc: 5       Patients Stated Pain Goal: 4 (71/69/[id number])  Complications: No apparent anesthesia complications

## 2017-06-04 LAB — CBC
HCT: 33.3 % — ABNORMAL LOW (ref 39.0–52.0)
HEMOGLOBIN: 11.9 g/dL — AB (ref 13.0–17.0)
MCH: 34.1 pg — ABNORMAL HIGH (ref 26.0–34.0)
MCHC: 35.7 g/dL (ref 30.0–36.0)
MCV: 95.4 fL (ref 78.0–100.0)
PLATELETS: 226 10*3/uL (ref 150–400)
RBC: 3.49 MIL/uL — AB (ref 4.22–5.81)
RDW: 13.4 % (ref 11.5–15.5)
WBC: 11.4 10*3/uL — ABNORMAL HIGH (ref 4.0–10.5)

## 2017-06-04 LAB — BASIC METABOLIC PANEL
Anion gap: 8 (ref 5–15)
BUN: 9 mg/dL (ref 6–20)
CHLORIDE: 109 mmol/L (ref 101–111)
CO2: 23 mmol/L (ref 22–32)
Calcium: 8.6 mg/dL — ABNORMAL LOW (ref 8.9–10.3)
Creatinine, Ser: 0.83 mg/dL (ref 0.61–1.24)
GFR calc Af Amer: >60 mL/min (ref >60)
GFR calc non Af Amer: >60 mL/min (ref >60)
GLUCOSE: 129 mg/dL — AB (ref 65–99)
Potassium: 3.9 mmol/L (ref 3.5–5.1)
Sodium: 140 mmol/L (ref 135–145)

## 2017-06-04 MED ORDER — ONDANSETRON HCL 4 MG PO TABS: 4.0000 mg | ORAL_TABLET | Freq: Four times a day (QID) | ORAL | 0 refills | Status: DC | PRN

## 2017-06-04 MED ORDER — ASPIRIN 81 MG PO CHEW: 81.0000 mg | CHEWABLE_TABLET | Freq: Two times a day (BID) | ORAL | 1 refills | Status: DC

## 2017-06-04 MED ORDER — DOCUSATE SODIUM 100 MG PO CAPS: 100.0000 mg | ORAL_CAPSULE | Freq: Two times a day (BID) | ORAL | 1 refills | Status: AC

## 2017-06-04 MED ORDER — SENNA 8.6 MG PO TABS: 2.0000 | ORAL_TABLET | Freq: Every day | ORAL | 0 refills | Status: AC

## 2017-06-04 MED ORDER — HYDROCODONE-ACETAMINOPHEN 5-325 MG PO TABS: 1.0000 | ORAL_TABLET | ORAL | 0 refills | Status: DC | PRN

## 2017-06-04 NOTE — Progress Notes (Signed)
Physical Therapy Treatment Patient Details Name: James Arroyo MRN: [id number] DOB: 12-Jan-1961 Today's Date: 06/04/2017    History of Present Illness Pt s/p R THR    PT Comments    Pt reports increased discomfort this am but progressing well with mobility and eager for dc home this date.   Follow Up Recommendations  DC plan and follow up therapy as arranged by surgeon     Equipment Recommendations  None recommended by PT    Recommendations for Other Services       Precautions / Restrictions Precautions Precautions: Fall Restrictions Weight Bearing Restrictions: No Other Position/Activity Restrictions: WBAT    Mobility  Bed Mobility Overal bed mobility: Needs Assistance Bed Mobility: Supine to Sit     Supine to sit: Min guard     General bed mobility comments: cues for sequence and use of L LE to self assist  Transfers Overall transfer level: Needs assistance Equipment used: Rolling walker (2 wheeled) Transfers: Sit to/from Stand Sit to Stand: Min guard         General transfer comment: cues for LE management and use of UEs to self assist  Ambulation/Gait Ambulation/Gait assistance: Min guard Ambulation Distance (Feet): 180 Feet Assistive device: Rolling walker (2 wheeled) Gait Pattern/deviations: Step-to pattern;Step-through pattern;Decreased step length - right;Decreased step length - left;Shuffle;Trunk flexed Gait velocity: decr Gait velocity interpretation: Below normal speed for age/gender General Gait Details: cues for sequence, posture and position from [employer]            Wheelchair Mobility    Modified Rankin (Stroke Patients Only)       Balance Overall balance assessment: No apparent balance deficits (not formally assessed)                                          Cognition Arousal/Alertness: Awake/alert Behavior During Therapy: WFL for tasks assessed/performed Overall Cognitive Status: Within  Functional Limits for tasks assessed                                        Exercises Total Joint Exercises Ankle Circles/Pumps: AROM;Both;15 reps;Supine Quad Sets: AROM;Both;10 reps;Supine Heel Slides: AAROM;Right;20 reps;Supine Hip ABduction/ADduction: AAROM;Right;15 reps;Supine    General Comments        Pertinent Vitals/Pain Pain Assessment: 0-10 Pain Score: 3  Pain Location: R hip Pain Descriptors / Indicators: Aching;Sore Pain Intervention(s): Limited activity within patient's tolerance;Monitored during session;Premedicated before session;Ice applied    Home Living                      Prior Function            PT Goals (current goals can now be found in the care plan section) Acute Rehab PT Goals Patient Stated Goal: Regain IND and be back for the other side PT Goal Formulation: With patient Time For Goal Achievement: 06/05/17 Potential to Achieve Goals: Good Progress towards PT goals: Progressing toward goals    Frequency    7X/week      PT Plan Current plan remains appropriate    Co-evaluation              AM-PAC PT "6 Clicks" Daily Activity  Outcome Measure  Difficulty turning over in bed (including adjusting bedclothes, sheets and blankets)?:  Unable Difficulty moving from lying on back to sitting on the side of the bed? : A Lot Difficulty sitting down on and standing up from a chair with arms (e.g., wheelchair, bedside commode, etc,.)?: A Lot Help needed moving to and from a bed to chair (including a wheelchair)?: A Little Help needed walking in hospital room?: A Little Help needed climbing 3-5 steps with a railing? : A Little 6 Click Score: 14    End of Session Equipment Utilized During Treatment: Gait belt Activity Tolerance: Patient tolerated treatment well Patient left: in chair;with call bell/phone within reach;with family/visitor present Nurse Communication: Mobility status PT Visit Diagnosis: Unsteadiness  on feet (R26.81);Difficulty in walking, not elsewhere classified (R26.2)     Time: 4103-0131 PT Time Calculation (min) (ACUTE ONLY): 30 min  Charges:  $Gait Training: 8-22 mins $Therapeutic Exercise: 8-22 mins                    G Codes:       Pg [phone]    Chiara Coltrin 06/04/2017, 12:07 PM

## 2017-06-04 NOTE — Progress Notes (Signed)
Spoke with patient at bedside, plan for d/c today. Home with HEP, has RW but needs 3n1. Contacted [facility] to deliver to room. [phone]

## 2017-06-04 NOTE — Progress Notes (Signed)
   Subjective:  Patient reports pain as mild to moderate.  Denies N/V/CP/SOB.  Objective:   VITALS:   Vitals:   06/03/17 2121 06/04/17 0042 06/04/17 0614 06/04/17 0910  BP: (!) 122/56 (!) 106/58 (!) 111/57 120/60  Pulse: 60 61 (!) 53 60  Resp: 17 17 18 17   Temp: 98.2 F (36.8 C) 98.3 F (36.8 C) 98 F (36.7 C) 98.2 F (36.8 C)  TempSrc: Oral Oral Oral Oral  SpO2: 100% 98% 99% 99%  Weight:      Height:        NAD ABD soft Sensation intact distally Intact pulses distally Dorsiflexion/Plantar flexion intact Incision: dressing C/D/I Compartment soft   Lab Results  Component Value Date   WBC 11.4 (H) 06/04/2017   HGB 11.9 (L) 06/04/2017   HCT 33.3 (L) 06/04/2017   MCV 95.4 06/04/2017   PLT 226 06/04/2017   BMET    Component Value Date/Time   NA 140 06/04/2017 0500   K 3.9 06/04/2017 0500   CL 109 06/04/2017 0500   CO2 23 06/04/2017 0500   GLUCOSE 129 (H) 06/04/2017 0500   BUN 9 06/04/2017 0500   CREATININE 0.83 06/04/2017 0500   CREATININE 0.95 02/25/2017 1023   CALCIUM 8.6 (L) 06/04/2017 0500   GFRNONAA >60 06/04/2017 0500   GFRAA >60 06/04/2017 0500     Assessment/Plan: 1 Day Post-Op   Principal Problem:   Osteoarthritis of right hip   WBAT with walker DVT ppx: ASA, SCDs, TEDs PO pain control PT/OT Dispo: D/C home with with HEP   Rodriquez Thorner, Horald Pollen 06/04/2017, 9:14 AM   Rod Can, MD Cell [phone]

## 2017-06-04 NOTE — Progress Notes (Signed)
Physical Therapy Treatment Patient Details Name: James Arroyo MRN: [id number] DOB: 10/13/1960 Today's Date: 06/04/2017    History of Present Illness Pt s/p R THR    PT Comments    Pt progressing well and eager for dc home.  Spouse present and reviewed stairs, car transfers, shower transfers and home therex program with written instruction provided.     Follow Up Recommendations  DC plan and follow up therapy as arranged by surgeon     Equipment Recommendations  None recommended by PT    Recommendations for Other Services       Precautions / Restrictions Precautions Precautions: Fall Restrictions Weight Bearing Restrictions: No Other Position/Activity Restrictions: WBAT    Mobility  Bed Mobility Overal bed mobility: Needs Assistance Bed Mobility: Supine to Sit;Sit to Supine     Supine to sit: Min guard Sit to supine: Min assist   General bed mobility comments: cues for sequence and use of L LE to self assist  Transfers Overall transfer level: Needs assistance Equipment used: Rolling walker (2 wheeled) Transfers: Sit to/from Stand Sit to Stand: Supervision         General transfer comment: cues for LE management and use of UEs to self assist  Ambulation/Gait Ambulation/Gait assistance: Min guard;Supervision Ambulation Distance (Feet): 150 Feet Assistive device: Rolling walker (2 wheeled) Gait Pattern/deviations: Step-to pattern;Step-through pattern;Decreased step length - right;Decreased step length - left;Shuffle;Trunk flexed Gait velocity: decr Gait velocity interpretation: Below normal speed for age/gender General Gait Details: min cues for sequence, posture and position from RW   Stairs Stairs: Yes   Stair Management: One rail Right;Step to pattern;Forwards;With cane Number of Stairs: 5 General stair comments: cues for sequence and foot/cane placement  Wheelchair Mobility    Modified Rankin (Stroke Patients Only)       Balance  Overall balance assessment: No apparent balance deficits (not formally assessed)                                          Cognition Arousal/Alertness: Awake/alert Behavior During Therapy: WFL for tasks assessed/performed Overall Cognitive Status: Within Functional Limits for tasks assessed                                        Exercises Total Joint Exercises Ankle Circles/Pumps: AROM;Both;15 reps;Supine Quad Sets: AROM;Both;10 reps;Supine Heel Slides: AAROM;Right;20 reps;Supine Hip ABduction/ADduction: AAROM;Right;15 reps;Supine Long Arc Quad: AROM;Right;10 reps;Seated    General Comments        Pertinent Vitals/Pain Pain Assessment: 0-10 Pain Score: 3  Pain Location: R hip Pain Descriptors / Indicators: Aching;Sore Pain Intervention(s): Limited activity within patient's tolerance;Monitored during session;Premedicated before session    Home Living                      Prior Function            PT Goals (current goals can now be found in the care plan section) Acute Rehab PT Goals Patient Stated Goal: Regain IND and be back for the other side PT Goal Formulation: With patient Time For Goal Achievement: 06/05/17 Potential to Achieve Goals: Good Progress towards PT goals: Progressing toward goals    Frequency    7X/week      PT Plan Current plan remains appropriate    Co-evaluation  AM-PAC PT "6 Clicks" Daily Activity  Outcome Measure  Difficulty turning over in bed (including adjusting bedclothes, sheets and blankets)?: A Lot Difficulty moving from lying on back to sitting on the side of the bed? : A Lot Difficulty sitting down on and standing up from a chair with arms (e.g., wheelchair, bedside commode, etc,.)?: A Little Help needed moving to and from a bed to chair (including a wheelchair)?: A Little Help needed walking in hospital room?: A Little Help needed climbing 3-5 steps with a railing?  : A Little 6 Click Score: 16    End of Session Equipment Utilized During Treatment: Gait belt Activity Tolerance: Patient tolerated treatment well Patient left: in chair;with call bell/phone within reach;with family/visitor present Nurse Communication: Mobility status PT Visit Diagnosis: Unsteadiness on feet (R26.81);Difficulty in walking, not elsewhere classified (R26.2)     Time: 1255-1350 PT Time Calculation (min) (ACUTE ONLY): 55 min  Charges:  $Gait Training: 23-37 mins $Therapeutic Exercise: 8-22 mins $Therapeutic Activity: 8-22 mins                    G Codes:       Pg [phone]    Adrine Hayworth 06/04/2017, 5:20 PM

## 2017-06-04 NOTE — Discharge Summary (Signed)
Physician Discharge Summary  Patient ID: James Arroyo MRN: [id number] DOB/AGE: Jul 26, 1961 56 y.o.  Admit date: 06/03/2017 Discharge date: 06/04/2017  Admission Diagnoses:  Osteoarthritis of right hip  Discharge Diagnoses:  Principal Problem:   Osteoarthritis of right hip   Past Medical History:  Diagnosis Date  . Hemorrhoids     Surgeries: Procedure(s): RIGHT TOTAL HIP ARTHROPLASTY ANTERIOR APPROACH on 06/03/2017   Consultants (if any):   Discharged Condition: Improved  Hospital Course: Reeves Musick is an 56 y.o. male who was admitted 06/03/2017 with a diagnosis of Osteoarthritis of right hip and went to the operating room on 06/03/2017 and underwent the above named procedures.    He was given perioperative antibiotics:  Anti-infectives    Start     Dose/Rate Route Frequency Ordered Stop   06/03/17 1400  ceFAZolin (ANCEF) IVPB 2g/100 mL premix     2 g 200 mL/hr over 30 Minutes Intravenous Every 6 hours 06/03/17 1108 06/03/17 2028   06/03/17 0633  ceFAZolin (ANCEF) 2-4 GM/100ML-% IVPB    Comments:  Dione Booze   : cabinet override      06/03/17 0633 06/03/17 0746   06/03/17 0545  ceFAZolin (ANCEF) IVPB 2g/100 mL premix     2 g 200 mL/hr over 30 Minutes Intravenous On call to O.R. 06/03/17 3151 06/03/17 0801    .  He was given sequential compression devices, early ambulation, and ASA for DVT prophylaxis.  He benefited maximally from the hospital stay and there were no complications.    Recent vital signs:  Vitals:   06/04/17 0614 06/04/17 0910  BP: (!) 111/57 120/60  Pulse: (!) 53 60  Resp: 18 17  Temp: 98 F (36.7 C) 98.2 F (36.8 C)  SpO2: 99% 99%    Recent laboratory studies:  Lab Results  Component Value Date   HGB 11.9 (L) 06/04/2017   HGB 14.0 05/26/2017   Lab Results  Component Value Date   WBC 11.4 (H) 06/04/2017   PLT 226 06/04/2017   No results found for: INR Lab Results  Component Value Date   NA 140 06/04/2017   K  3.9 06/04/2017   CL 109 06/04/2017   CO2 23 06/04/2017   BUN 9 06/04/2017   CREATININE 0.83 06/04/2017   GLUCOSE 129 (H) 06/04/2017    Discharge Medications:   Allergies as of 06/04/2017   No Known Allergies     Medication List    STOP taking these medications   aspirin EC 81 MG tablet Replaced by:  aspirin 81 MG chewable tablet     TAKE these medications   aspirin 81 MG chewable tablet Chew 1 tablet (81 mg total) by mouth 2 (two) times daily. Replaces:  aspirin EC 81 MG tablet   docusate sodium 100 MG capsule Commonly known as:  COLACE Take 1 capsule (100 mg total) by mouth 2 (two) times daily.   HYDROcodone-acetaminophen 5-325 MG tablet Commonly known as:  NORCO/VICODIN Take 1-2 tablets by mouth every 4 (four) hours as needed (breakthrough pain).   meloxicam 15 MG tablet Commonly known as:  MOBIC Take 15 mg by mouth daily.   ondansetron 4 MG tablet Commonly known as:  ZOFRAN Take 1 tablet (4 mg total) by mouth every 6 (six) hours as needed for nausea.   senna 8.6 MG Tabs tablet Commonly known as:  SENOKOT Take 2 tablets (17.2 mg total) by mouth at bedtime.            Durable Medical Equipment  Start     Ordered   06/04/17 1115  For home use only DME 3 n 1  Once     06/04/17 1115   06/04/17 0931  For home use only DME 3 n 1  Once     06/04/17 0930   06/03/17 1109  DME Walker rolling  Once    Question:  Patient needs a walker to treat with the following condition  Answer:  Status post total replacement of right hip   06/03/17 1108   06/03/17 1109  DME 3 n 1  Once     06/03/17 1108       Discharge Care Instructions        Start     Ordered   06/04/17 0000  aspirin 81 MG chewable tablet  2 times daily     06/04/17 0916   06/04/17 0000  docusate sodium (COLACE) 100 MG capsule  2 times daily     06/04/17 0916   06/04/17 0000  HYDROcodone-acetaminophen (NORCO/VICODIN) 5-325 MG tablet  Every 4 hours PRN     06/04/17 0916   06/04/17 0000   ondansetron (ZOFRAN) 4 MG tablet  Every 6 hours PRN     06/04/17 0916   06/04/17 0000  senna (SENOKOT) 8.6 MG TABS tablet  Daily at bedtime     06/04/17 0916   06/04/17 0000  Call MD / Call 911    Comments:  If you experience chest pain or shortness of breath, CALL 911 and be transported to the hospital emergency room.  If you develope a fever above 101 F, pus (white drainage) or increased drainage or redness at the wound, or calf pain, call your surgeon's office.   06/04/17 0916   06/04/17 0000  Diet - low sodium heart healthy     06/04/17 0916   06/04/17 0000  Constipation Prevention    Comments:  Drink plenty of fluids.  Prune juice may be helpful.  You may use a stool softener, such as Colace (over the counter) 100 mg twice a day.  Use MiraLax (over the counter) for constipation as needed.   06/04/17 0916   06/04/17 0000  Increase activity slowly as tolerated     06/04/17 0916   06/04/17 0000  TED hose    Comments:  Use stockings (TED hose) for 2 weeks on both leg(s).  You may remove them at night for sleeping.   06/04/17 0916   06/04/17 0000  Driving restrictions    Comments:  No driving for 6 weeks   06/04/17 0916   06/04/17 0000  Lifting restrictions    Comments:  No lifting for 6 weeks   06/04/17 0916      Diagnostic Studies: Dg Pelvis Portable  Result Date: 06/03/2017 CLINICAL DATA:  Total right hip replacement. EXAM: PORTABLE PELVIS 1-2 VIEWS COMPARISON:  06/03/2017 . FINDINGS: Total right hip replacement with postsurgical changes noted . Anatomic alignment. Hardware intact . No acute bony abnormality identified. IMPRESSION: Total right hip replacement.  Anatomic alignment.  Hardware intact. Electronically Signed   By: Marcello Moores  Register   On: 06/03/2017 10:49   Dg C-arm 1-60 Min-no Report  Result Date: 06/03/2017 Fluoroscopy was utilized by the requesting physician.  No radiographic interpretation.   Dg Hip Operative Unilat W Or W/o Pelvis Right  Result Date:  06/03/2017 CLINICAL DATA:  Right total hip arthroplasty. EXAM: OPERATIVE right HIP (WITH PELVIS IF PERFORMED) 2 VIEWS TECHNIQUE: Fluoroscopic spot image(s) were submitted for interpretation post-operatively. COMPARISON:  None. FINDINGS: Well seated acetabular and femoral components of a total right hip arthroplasty. No complicating features are demonstrated. IMPRESSION: Well seated components of a total right hip arthroplasty. Electronically Signed   By: Marijo Sanes M.D.   On: 06/03/2017 09:49    Disposition: 01-Home or Self Care  Discharge Instructions    Call MD / Call 911    Complete by:  As directed    If you experience chest pain or shortness of breath, CALL 911 and be transported to the hospital emergency room.  If you develope a fever above 101 F, pus (white drainage) or increased drainage or redness at the wound, or calf pain, call your surgeon's office.   Constipation Prevention    Complete by:  As directed    Drink plenty of fluids.  Prune juice may be helpful.  You may use a stool softener, such as Colace (over the counter) 100 mg twice a day.  Use MiraLax (over the counter) for constipation as needed.   Diet - low sodium heart healthy    Complete by:  As directed    Driving restrictions    Complete by:  As directed    No driving for 6 weeks   Increase activity slowly as tolerated    Complete by:  As directed    Lifting restrictions    Complete by:  As directed    No lifting for 6 weeks   TED hose    Complete by:  As directed    Use stockings (TED hose) for 2 weeks on both leg(s).  You may remove them at night for sleeping.      Follow-up Information    Kharma Sampsel, Aaron Edelman, MD. Schedule an appointment as soon as possible for a visit in 2 weeks.   Specialty:  Orthopedic Surgery Why:  For wound re-check Contact information: [address] [address] [phone]            Signed: Elie Goody 06/04/2017, 3:24 PM

## 2017-06-23 ENCOUNTER — Ambulatory Visit: Payer: Self-pay | Admitting: Orthopedic Surgery

## 2017-07-14 NOTE — Progress Notes (Signed)
05-08-17 EKG on chart  04-19-17 Surgical Clearance on chart

## 2017-07-14 NOTE — Patient Instructions (Signed)
Braxxton Stoudt  07/14/2017   Your procedure is scheduled on: 07-22-17  Report to [facility]  Entrance Take [place] to 3rd floor to  [place] at 5:30 AM.    Call this number if you have problems the morning of surgery [phone]    Remember: ONLY 1 PERSON MAY GO WITH YOU TO SHORT STAY TO GET  READY MORNING OF [place].  Do not eat food or drink liquids :After Midnight.     Take these medicines the morning of surgery with A SIP OF WATER: None                                You may not have any metal on your body including hair pins and              piercings  Do not wear jewelry, lotions, powders or perfumes, deodorant             Men may shave face and neck.   Do not bring valuables to the hospital. [place].  Contacts, dentures or bridgework may not be worn into surgery.  Leave suitcase in the car. After surgery it may be brought to your room.                  Please read over the following fact sheets you were given: _____________________________________________________________________             [facility] - Preparing for Surgery Before surgery, you can play an important role.  Because skin is not sterile, your skin needs to be as free of germs as possible.  You can reduce the number of germs on your skin by washing with CHG (chlorahexidine gluconate) soap before surgery.  CHG is an antiseptic cleaner which kills germs and bonds with the skin to continue killing germs even after washing. Please DO NOT use if you have an allergy to CHG or antibacterial soaps.  If your skin becomes reddened/irritated stop using the CHG and inform your nurse when you arrive at Short Stay. Do not shave (including legs and underarms) for at least 48 hours prior to the first CHG shower.  You may shave your face/neck. Please follow these instructions carefully:  1.  Shower with CHG Soap the  night before surgery and the  morning of Surgery.  2.  If you choose to wash your hair, wash your hair first as usual with your  normal  shampoo.  3.  After you shampoo, rinse your hair and body thoroughly to remove the  shampoo.                           4.  Use CHG as you would any other liquid soap.  You can apply chg directly  to the skin and wash                       Gently with a scrungie or clean washcloth.  5.  Apply the CHG Soap to your body ONLY FROM THE NECK DOWN.   Do not use on face/ open  Wound or open sores. Avoid contact with eyes, ears mouth and genitals (private parts).                       Wash face,  Genitals (private parts) with your normal soap.             6.  Wash thoroughly, paying special attention to the area where your surgery  will be performed.  7.  Thoroughly rinse your body with warm water from the neck down.  8.  DO NOT shower/wash with your normal soap after using and rinsing off  the CHG Soap.                9.  Pat yourself dry with a clean towel.            10.  Wear clean pajamas.            11.  Place clean sheets on your bed the night of your first shower and do not  sleep with pets. Day of Surgery : Do not apply any lotions/deodorants the morning of surgery.  Please wear clean clothes to the hospital/surgery center.  FAILURE TO FOLLOW THESE INSTRUCTIONS MAY RESULT IN THE CANCELLATION OF YOUR SURGERY PATIENT SIGNATURE_________________________________  NURSE SIGNATURE__________________________________  ________________________________________________________________________   Adam Phenix  An incentive spirometer is a tool that can help keep your lungs clear and active. This tool measures how well you are filling your lungs with each breath. Taking long deep breaths may help reverse or decrease the chance of developing breathing (pulmonary) problems (especially infection) following:  A long period of time when you  are unable to move or be active. BEFORE THE PROCEDURE   If the spirometer includes an indicator to show your best effort, your nurse or respiratory therapist will set it to a desired goal.  If possible, sit up straight or lean slightly forward. Try not to slouch.  Hold the incentive spirometer in an upright position. INSTRUCTIONS FOR USE  1. Sit on the edge of your bed if possible, or sit up as far as you can in bed or on a chair. 2. Hold the incentive spirometer in an upright position. 3. Breathe out normally. 4. Place the mouthpiece in your mouth and seal your lips tightly around it. 5. Breathe in slowly and as deeply as possible, raising the piston or the ball toward the top of the column. 6. Hold your breath for 3-5 seconds or for as long as possible. Allow the piston or ball to fall to the bottom of the column. 7. Remove the mouthpiece from your mouth and breathe out normally. 8. Rest for a few seconds and repeat Steps 1 through 7 at least 10 times every 1-2 hours when you are awake. Take your time and take a few normal breaths between deep breaths. 9. The spirometer may include an indicator to show your best effort. Use the indicator as a goal to work toward during each repetition. 10. After each set of 10 deep breaths, practice coughing to be sure your lungs are clear. If you have an incision (the cut made at the time of surgery), support your incision when coughing by placing a pillow or rolled up towels firmly against it. Once you are able to get out of bed, walk around indoors and cough well. You may stop using the incentive spirometer when instructed by your caregiver.  RISKS AND COMPLICATIONS  Take your time so you do not get  dizzy or light-headed.  If you are in pain, you may need to take or ask for pain medication before doing incentive spirometry. It is harder to take a deep breath if you are having pain. AFTER USE  Rest and breathe slowly and easily.  It can be helpful to  keep track of a log of your progress. Your caregiver can provide you with a simple table to help with this. If you are using the spirometer at home, follow these instructions: [place] IF:   You are having difficultly using the spirometer.  You have trouble using the spirometer as often as instructed.  Your pain medication is not giving enough relief while using the spirometer.  You develop fever of 100.5 F (38.1 C) or higher. SEEK IMMEDIATE MEDICAL CARE IF:   You cough up bloody sputum that had not been present before.  You develop fever of 102 F (38.9 C) or greater.  You develop worsening pain at or near the incision site. MAKE SURE YOU:   Understand these instructions.  Will watch your condition.  Will get help right away if you are not doing well or get worse. Document Released: 01/18/2007 Document Revised: 11/30/2011 Document Reviewed: 03/21/2007 ExitCare Patient Information 2014 ExitCare, [place].   ________________________________________________________________________  WHAT IS A BLOOD TRANSFUSION? Blood Transfusion Information  A transfusion is the replacement of blood or some of its parts. Blood is made up of multiple cells which provide different functions.  Red blood cells carry oxygen and are used for blood loss replacement.  White blood cells fight against infection.  Platelets control bleeding.  Plasma helps clot blood.  Other blood products are available for specialized needs, such as hemophilia or other clotting disorders. BEFORE THE TRANSFUSION  Who gives blood for transfusions?   Healthy volunteers who are fully evaluated to make sure their blood is safe. This is blood bank blood. Transfusion therapy is the safest it has ever been in the practice of medicine. Before blood is taken from a donor, a complete history is taken to make sure that person has no history of diseases nor engages in risky social behavior (examples are intravenous drug  use or sexual activity with multiple partners). The donor's travel history is screened to minimize risk of transmitting infections, such as malaria. The donated blood is tested for signs of infectious diseases, such as HIV and hepatitis. The blood is then tested to be sure it is compatible with you in order to minimize the chance of a transfusion reaction. If you or a relative donates blood, this is often done in anticipation of surgery and is not appropriate for emergency situations. It takes many days to process the donated blood. RISKS AND COMPLICATIONS Although transfusion therapy is very safe and saves many lives, the main dangers of transfusion include:   Getting an infectious disease.  Developing a transfusion reaction. This is an allergic reaction to something in the blood you were given. Every precaution is taken to prevent this. The decision to have a blood transfusion has been considered carefully by your caregiver before blood is given. Blood is not given unless the benefits outweigh the risks. AFTER THE TRANSFUSION  Right after receiving a blood transfusion, you will usually feel much better and more energetic. This is especially true if your red blood cells have gotten low (anemic). The transfusion raises the level of the red blood cells which carry oxygen, and this usually causes an energy increase.  The nurse administering the transfusion will  monitor you carefully for complications. HOME CARE INSTRUCTIONS  No special instructions are needed after a transfusion. You may find your energy is better. Speak with your caregiver about any limitations on activity for underlying diseases you may have. SEEK MEDICAL CARE IF:   Your condition is not improving after your transfusion.  You develop redness or irritation at the intravenous (IV) site. SEEK IMMEDIATE MEDICAL CARE IF:  Any of the following symptoms occur over the next 12 hours:  Shaking chills.  You have a temperature by mouth  above 102 F (38.9 C), not controlled by medicine.  Chest, back, or muscle pain.  People around you feel you are not acting correctly or are confused.  Shortness of breath or difficulty breathing.  Dizziness and fainting.  You get a rash or develop hives.  You have a decrease in urine output.  Your urine turns a dark color or changes to pink, red, or brown. Any of the following symptoms occur over the next 10 days:  You have a temperature by mouth above 102 F (38.9 C), not controlled by medicine.  Shortness of breath.  Weakness after normal activity.  The white part of the eye turns yellow (jaundice).  You have a decrease in the amount of urine or are urinating less often.  Your urine turns a dark color or changes to pink, red, or brown. Document Released: 09/04/2000 Document Revised: 11/30/2011 Document Reviewed: 04/23/2008 [facility] Patient Information 2014 [place], [place].  _______________________________________________________________________

## 2017-07-16 ENCOUNTER — Ambulatory Visit: Payer: Self-pay | Admitting: Orthopedic Surgery

## 2017-07-16 ENCOUNTER — Encounter (HOSPITAL_COMMUNITY): Payer: Self-pay

## 2017-07-16 ENCOUNTER — Encounter (HOSPITAL_COMMUNITY)
Admission: RE | Admit: 2017-07-16 | Discharge: 2017-07-16 | Disposition: A | Discharge status: Home / Self Care | Payer: 59 | Source: Ambulatory Visit | Attending: Orthopedic Surgery | Admitting: Orthopedic Surgery

## 2017-07-16 DIAGNOSIS — Z01812 Encounter for preprocedural laboratory examination: Secondary | ICD-10-CM | POA: Diagnosis not present

## 2017-07-16 LAB — CBC
HEMATOCRIT: 39.8 % (ref 39.0–52.0)
Hemoglobin: 13.7 g/dL (ref 13.0–17.0)
MCH: 33.1 pg (ref 26.0–34.0)
MCHC: 34.4 g/dL (ref 30.0–36.0)
MCV: 96.1 fL (ref 78.0–100.0)
Platelets: 270 10*3/uL (ref 150–400)
RBC: 4.14 MIL/uL — AB (ref 4.22–5.81)
RDW: 13.2 % (ref 11.5–15.5)
WBC: 4.5 10*3/uL (ref 4.0–10.5)

## 2017-07-16 LAB — BASIC METABOLIC PANEL
ANION GAP: 9 (ref 5–15)
BUN: 18 mg/dL (ref 6–20)
CHLORIDE: 105 mmol/L (ref 101–111)
CO2: 26 mmol/L (ref 22–32)
Calcium: 9.7 mg/dL (ref 8.9–10.3)
Creatinine, Ser: 1 mg/dL (ref 0.61–1.24)
Glucose, Bld: 101 mg/dL — ABNORMAL HIGH (ref 65–99)
POTASSIUM: 4 mmol/L (ref 3.5–5.1)
SODIUM: 140 mmol/L (ref 135–145)

## 2017-07-16 LAB — SURGICAL PCR SCREEN
MRSA, PCR: NEGATIVE
STAPHYLOCOCCUS AUREUS: POSITIVE — AB

## 2017-07-16 NOTE — H&P (Signed)
TOTAL HIP ADMISSION H&P  Patient is admitted for left total hip arthroplasty.  Subjective:  Chief Complaint: left hip pain  HPI: James Arroyo, 56 y.o. male, has a history of pain and functional disability in the left hip(s) due to arthritis and patient has failed non-surgical conservative treatments for greater than 12 weeks to include NSAID's and/or analgesics, flexibility and strengthening excercises, weight reduction as appropriate and activity modification.  Onset of symptoms was gradual starting 5 years ago with rapidlly worsening course since that time.The patient noted no past surgery on the left hip(s).  Patient currently rates pain in the left hip at 10 out of 10 with activity. Patient has night pain, worsening of pain with activity and weight bearing, trendelenberg gait, pain that interfers with activities of daily living, pain with passive range of motion and crepitus. Patient has evidence of subchondral cysts, subchondral sclerosis, periarticular osteophytes and joint space narrowing by imaging studies. This condition presents safety issues increasing the risk of falls. There is no current active infection.  Patient Active Problem List   Diagnosis Date Noted  . Osteoarthritis of right hip 06/03/2017  . Alcohol dependence ([place]) 09/09/2015  . Hepatitis B 07/30/2015  . Hemorrhoids 07/30/2015   Past Medical History:  Diagnosis Date  . Hemorrhoids     Past Surgical History:  Procedure Laterality Date  . cyst eyelids    . TONSILLECTOMY    . TOTAL HIP ARTHROPLASTY Right 06/03/2017   Procedure: RIGHT TOTAL HIP ARTHROPLASTY ANTERIOR APPROACH;  Surgeon: Rod Can, MD;  Location: [facility];  Service: Orthopedics;  Laterality: Right;  Needs RNFA    Current Outpatient Prescriptions  Medication Sig Dispense Refill Last Dose  . aspirin 81 MG chewable tablet Chew 1 tablet (81 mg total) by mouth 2 (two) times daily. (Patient not taking: Reported on 07/16/2017) 60 tablet 1 Not  Taking at Unknown time  . docusate sodium (COLACE) 100 MG capsule Take 1 capsule (100 mg total) by mouth 2 (two) times daily. (Patient not taking: Reported on 07/09/2017) 60 capsule 1 Not Taking at Unknown time  . HYDROcodone-acetaminophen (NORCO/VICODIN) 5-325 MG tablet Take 1-2 tablets by mouth every 4 (four) hours as needed (breakthrough pain). 60 tablet 0   . meloxicam (MOBIC) 15 MG tablet Take 15 mg by mouth at bedtime.    Past Month at Unknown time  . ondansetron (ZOFRAN) 4 MG tablet Take 1 tablet (4 mg total) by mouth every 6 (six) hours as needed for nausea. (Patient not taking: Reported on 07/09/2017) 20 tablet 0 Not Taking at Unknown time  . senna (SENOKOT) 8.6 MG TABS tablet Take 2 tablets (17.2 mg total) by mouth at bedtime. (Patient not taking: Reported on 07/09/2017) 120 each 0 Not Taking at Unknown time   No current facility-administered medications for this visit.    No Known Allergies  Social History  Substance Use Topics  . Smoking status: Former Smoker    Quit date: 1994  . Smokeless tobacco: Never Used  . Alcohol use No     Comment: Recovering alcoholic    Family History  Problem Relation Age of Onset  . Cancer Mother   . Kidney disease Father   . Hypertension Father   . Colon cancer Neg Hx   . Pancreatic cancer Neg Hx   . Stomach cancer Neg Hx      Review of Systems  Constitutional: Negative.   HENT: Positive for tinnitus.   Eyes: Negative.   Respiratory: Negative.   Cardiovascular: Negative.  Gastrointestinal: Negative.   Genitourinary: Negative.   Musculoskeletal: Positive for back pain.  Skin: Positive for itching.  Neurological: Negative.   Endo/Heme/Allergies: Negative.   Psychiatric/Behavioral: The patient has insomnia.     Objective:  Physical Exam  Vitals reviewed. Constitutional: He is oriented to person, place, and time. He appears well-developed and well-nourished.  HENT:  Head: Normocephalic and atraumatic.  Eyes: Pupils are equal,  round, and reactive to light. Conjunctivae and EOM are normal.  Neck: Normal range of motion. Neck supple.  Cardiovascular: Normal rate, regular rhythm and intact distal pulses.   Respiratory: Effort normal. No respiratory distress.  GI: He exhibits no distension.  Genitourinary:  Genitourinary Comments: deferred  Musculoskeletal:       Left hip: He exhibits decreased range of motion and bony tenderness.  Neurological: He is alert and oriented to person, place, and time. He has normal reflexes.  Skin: Skin is warm and dry.  Psychiatric: He has a normal mood and affect. His behavior is normal. Judgment and thought content normal.    Vital signs in last 24 hours: @VSRANGES @  Labs:   Estimated body mass index is 24.9 kg/m as calculated from the following:   Height as of an earlier encounter on 07/16/17: 5\' 6"  (1.676 m).   Weight as of an earlier encounter on 07/16/17: 70 kg (154 lb 4 oz).   Imaging Review Plain radiographs demonstrate severe degenerative joint disease of the left hip(s). The bone quality appears to be adequate for age and reported activity level.  Assessment/Plan:  End stage arthritis, left hip(s)  The patient history, physical examination, clinical judgement of the provider and imaging studies are consistent with end stage degenerative joint disease of the left hip(s) and total hip arthroplasty is deemed medically necessary. The treatment options including medical management, injection therapy, arthroscopy and arthroplasty were discussed at length. The risks and benefits of total hip arthroplasty were presented and reviewed. The risks due to aseptic loosening, infection, stiffness, dislocation/subluxation,  thromboembolic complications and other imponderables were discussed.  The patient acknowledged the explanation, agreed to proceed with the plan and consent was signed. Patient is being admitted for inpatient treatment for surgery, pain control, PT, OT, prophylactic  antibiotics, VTE prophylaxis, progressive ambulation and ADL's and discharge planning.The patient is planning to be discharged home with HEP

## 2017-07-19 NOTE — Progress Notes (Signed)
07-16-17 PCR result routed to Dr. Lyla Glassing for review.

## 2017-07-22 ENCOUNTER — Inpatient Hospital Stay (HOSPITAL_COMMUNITY): Payer: 59 | Admitting: Registered Nurse

## 2017-07-22 ENCOUNTER — Inpatient Hospital Stay (HOSPITAL_COMMUNITY): Payer: 59

## 2017-07-22 ENCOUNTER — Inpatient Hospital Stay (HOSPITAL_COMMUNITY)
Admission: RE | Admit: 2017-07-22 | Discharge: 2017-07-23 | DRG: 470 | Disposition: A | Discharge status: Home / Self Care | Payer: 59 | Source: Ambulatory Visit | Attending: Orthopedic Surgery | Admitting: Orthopedic Surgery

## 2017-07-22 ENCOUNTER — Encounter (HOSPITAL_COMMUNITY)
Admission: RE | Disposition: A | Discharge status: Home / Self Care | Payer: Self-pay | Source: Ambulatory Visit | Attending: Orthopedic Surgery

## 2017-07-22 ENCOUNTER — Encounter (HOSPITAL_COMMUNITY): Payer: Self-pay

## 2017-07-22 DIAGNOSIS — M25752 Osteophyte, left hip: Secondary | ICD-10-CM | POA: Diagnosis present

## 2017-07-22 DIAGNOSIS — Z09 Encounter for follow-up examination after completed treatment for conditions other than malignant neoplasm: Secondary | ICD-10-CM

## 2017-07-22 DIAGNOSIS — Z419 Encounter for procedure for purposes other than remedying health state, unspecified: Secondary | ICD-10-CM

## 2017-07-22 DIAGNOSIS — H9319 Tinnitus, unspecified ear: Secondary | ICD-10-CM | POA: Diagnosis present

## 2017-07-22 DIAGNOSIS — Z87891 Personal history of nicotine dependence: Secondary | ICD-10-CM | POA: Diagnosis not present

## 2017-07-22 DIAGNOSIS — M1612 Unilateral primary osteoarthritis, left hip: Secondary | ICD-10-CM

## 2017-07-22 DIAGNOSIS — Z9181 History of falling: Secondary | ICD-10-CM | POA: Diagnosis not present

## 2017-07-22 DIAGNOSIS — Z8619 Personal history of other infectious and parasitic diseases: Secondary | ICD-10-CM

## 2017-07-22 DIAGNOSIS — G4700 Insomnia, unspecified: Secondary | ICD-10-CM | POA: Diagnosis present

## 2017-07-22 DIAGNOSIS — Z96641 Presence of right artificial hip joint: Secondary | ICD-10-CM | POA: Diagnosis present

## 2017-07-22 DIAGNOSIS — Z8719 Personal history of other diseases of the digestive system: Secondary | ICD-10-CM | POA: Diagnosis not present

## 2017-07-22 DIAGNOSIS — M549 Dorsalgia, unspecified: Secondary | ICD-10-CM | POA: Diagnosis present

## 2017-07-22 DIAGNOSIS — L299 Pruritus, unspecified: Secondary | ICD-10-CM | POA: Diagnosis present

## 2017-07-22 DIAGNOSIS — Z9049 Acquired absence of other specified parts of digestive tract: Secondary | ICD-10-CM | POA: Diagnosis not present

## 2017-07-22 HISTORY — PX: TOTAL HIP ARTHROPLASTY: SHX124

## 2017-07-22 LAB — TYPE AND SCREEN
ABO/RH(D): O POS
Antibody Screen: NEGATIVE

## 2017-07-22 SURGERY — ARTHROPLASTY, HIP, TOTAL, ANTERIOR APPROACH
Anesthesia: General | Site: Hip | Laterality: Left

## 2017-07-22 MED ORDER — POVIDONE-IODINE 10 % EX SWAB
2.0000 "application " | Freq: Once | CUTANEOUS | Status: AC
  Administered 2017-07-22: 2 via TOPICAL

## 2017-07-22 MED ORDER — DEXTROSE 5 % IV SOLN
500.0000 mg | Freq: Four times a day (QID) | INTRAVENOUS | Status: DC | PRN
  Administered 2017-07-22: 500 mg via INTRAVENOUS
  Filled 2017-07-22: qty 550

## 2017-07-22 MED ORDER — ACETAMINOPHEN 650 MG RE SUPP: 650.0000 mg | RECTAL | Status: DC | PRN

## 2017-07-22 MED ORDER — ALUM & MAG HYDROXIDE-SIMETH 200-200-20 MG/5ML PO SUSP: 30.0000 mL | ORAL | Status: DC | PRN

## 2017-07-22 MED ORDER — ACETAMINOPHEN 10 MG/ML IV SOLN
1000.0000 mg | INTRAVENOUS | Status: AC
  Administered 2017-07-22: 1000 mg via INTRAVENOUS
  Filled 2017-07-22: qty 100

## 2017-07-22 MED ORDER — CHLORHEXIDINE GLUCONATE 4 % EX LIQD: 60.0000 mL | Freq: Once | CUTANEOUS | Status: DC

## 2017-07-22 MED ORDER — METOCLOPRAMIDE HCL 5 MG PO TABS: 5.0000 mg | ORAL_TABLET | Freq: Three times a day (TID) | ORAL | Status: DC | PRN

## 2017-07-22 MED ORDER — ISOPROPYL ALCOHOL 70 % SOLN
Status: AC
  Filled 2017-07-22: qty 480

## 2017-07-22 MED ORDER — LIDOCAINE 2% (20 MG/ML) 5 ML SYRINGE
INTRAMUSCULAR | Status: AC
  Filled 2017-07-22: qty 5

## 2017-07-22 MED ORDER — METHOCARBAMOL 500 MG PO TABS
500.0000 mg | ORAL_TABLET | Freq: Four times a day (QID) | ORAL | Status: DC | PRN
  Administered 2017-07-22: 21:00:00 500 mg via ORAL
  Filled 2017-07-22: qty 1

## 2017-07-22 MED ORDER — LIDOCAINE 2% (20 MG/ML) 5 ML SYRINGE
INTRAMUSCULAR | Status: DC | PRN
  Administered 2017-07-22: 100 mg via INTRAVENOUS

## 2017-07-22 MED ORDER — ONDANSETRON HCL 4 MG/2ML IJ SOLN: 4.0000 mg | Freq: Four times a day (QID) | INTRAMUSCULAR | Status: DC | PRN

## 2017-07-22 MED ORDER — SODIUM CHLORIDE 0.9 % IJ SOLN
INTRAMUSCULAR | Status: AC
  Filled 2017-07-22: qty 50

## 2017-07-22 MED ORDER — ONDANSETRON HCL 4 MG/2ML IJ SOLN: 4.0000 mg | Freq: Once | INTRAMUSCULAR | Status: DC | PRN

## 2017-07-22 MED ORDER — ASPIRIN 81 MG PO CHEW
81.0000 mg | CHEWABLE_TABLET | Freq: Two times a day (BID) | ORAL | Status: DC
  Administered 2017-07-22 – 2017-07-23 (×2): 81 mg via ORAL
  Filled 2017-07-22 (×2): qty 1

## 2017-07-22 MED ORDER — DEXAMETHASONE SODIUM PHOSPHATE 10 MG/ML IJ SOLN
INTRAMUSCULAR | Status: AC
  Filled 2017-07-22: qty 1

## 2017-07-22 MED ORDER — PROPOFOL 10 MG/ML IV BOLUS
INTRAVENOUS | Status: AC
  Filled 2017-07-22: qty 40

## 2017-07-22 MED ORDER — DIPHENHYDRAMINE HCL 12.5 MG/5ML PO ELIX: 12.5000 mg | ORAL_SOLUTION | ORAL | Status: DC | PRN

## 2017-07-22 MED ORDER — PROPOFOL 10 MG/ML IV BOLUS
INTRAVENOUS | Status: AC
  Filled 2017-07-22: qty 20

## 2017-07-22 MED ORDER — SODIUM CHLORIDE 0.9 % IR SOLN
Status: DC | PRN
  Administered 2017-07-22: 1000 mL

## 2017-07-22 MED ORDER — ACETAMINOPHEN 325 MG PO TABS: 650.0000 mg | ORAL_TABLET | ORAL | Status: DC | PRN

## 2017-07-22 MED ORDER — BUPIVACAINE HCL (PF) 0.5 % IJ SOLN
INTRAMUSCULAR | Status: AC
  Filled 2017-07-22: qty 30

## 2017-07-22 MED ORDER — ISOPROPYL ALCOHOL 70 % SOLN
Status: DC | PRN
  Administered 2017-07-22: 1 via TOPICAL

## 2017-07-22 MED ORDER — METOCLOPRAMIDE HCL 5 MG/ML IJ SOLN: 5.0000 mg | Freq: Three times a day (TID) | INTRAMUSCULAR | Status: DC | PRN

## 2017-07-22 MED ORDER — SODIUM CHLORIDE 0.9 % IV SOLN: INTRAVENOUS | Status: DC

## 2017-07-22 MED ORDER — SODIUM CHLORIDE 0.9 % IJ SOLN
INTRAMUSCULAR | Status: DC | PRN
  Administered 2017-07-22: 30 mL

## 2017-07-22 MED ORDER — ONDANSETRON HCL 4 MG/2ML IJ SOLN
INTRAMUSCULAR | Status: DC | PRN
  Administered 2017-07-22: 4 mg via INTRAVENOUS

## 2017-07-22 MED ORDER — PROPOFOL 10 MG/ML IV BOLUS
INTRAVENOUS | Status: DC | PRN
  Administered 2017-07-22: 20 mg via INTRAVENOUS
  Administered 2017-07-22: 120 mg via INTRAVENOUS

## 2017-07-22 MED ORDER — DEXAMETHASONE SODIUM PHOSPHATE 10 MG/ML IJ SOLN
10.0000 mg | Freq: Once | INTRAMUSCULAR | Status: AC
  Administered 2017-07-23: 10 mg via INTRAVENOUS
  Filled 2017-07-22: qty 1

## 2017-07-22 MED ORDER — MIDAZOLAM HCL 2 MG/2ML IJ SOLN
INTRAMUSCULAR | Status: AC
  Filled 2017-07-22: qty 2

## 2017-07-22 MED ORDER — SENNA 8.6 MG PO TABS
2.0000 | ORAL_TABLET | Freq: Every day | ORAL | Status: DC
  Administered 2017-07-22: 17.2 mg via ORAL
  Filled 2017-07-22: qty 2

## 2017-07-22 MED ORDER — FENTANYL CITRATE (PF) 100 MCG/2ML IJ SOLN
INTRAMUSCULAR | Status: AC
  Filled 2017-07-22: qty 2

## 2017-07-22 MED ORDER — HYDROMORPHONE HCL 1 MG/ML IJ SOLN
INTRAMUSCULAR | Status: AC
  Filled 2017-07-22: qty 1

## 2017-07-22 MED ORDER — PROPOFOL 500 MG/50ML IV EMUL
INTRAVENOUS | Status: DC | PRN
  Administered 2017-07-22: 40 ug/kg/min via INTRAVENOUS

## 2017-07-22 MED ORDER — HYDROMORPHONE HCL 1 MG/ML IJ SOLN: 0.5000 mg | INTRAMUSCULAR | Status: DC | PRN

## 2017-07-22 MED ORDER — ONDANSETRON HCL 4 MG/2ML IJ SOLN
INTRAMUSCULAR | Status: AC
  Filled 2017-07-22: qty 2

## 2017-07-22 MED ORDER — HYDROCODONE-ACETAMINOPHEN 5-325 MG PO TABS: 1.0000 | ORAL_TABLET | ORAL | Status: DC | PRN

## 2017-07-22 MED ORDER — CEFAZOLIN SODIUM-DEXTROSE 2-4 GM/100ML-% IV SOLN
2.0000 g | INTRAVENOUS | Status: AC
  Administered 2017-07-22: 2 g via INTRAVENOUS
  Filled 2017-07-22: qty 100

## 2017-07-22 MED ORDER — MENTHOL 3 MG MT LOZG: 1.0000 | LOZENGE | OROMUCOSAL | Status: DC | PRN

## 2017-07-22 MED ORDER — LACTATED RINGERS IV SOLN
INTRAVENOUS | Status: DC | PRN
  Administered 2017-07-22: 07:00:00 via INTRAVENOUS

## 2017-07-22 MED ORDER — ONDANSETRON HCL 4 MG PO TABS: 4.0000 mg | ORAL_TABLET | Freq: Four times a day (QID) | ORAL | Status: DC | PRN

## 2017-07-22 MED ORDER — HYDROCODONE-ACETAMINOPHEN 5-325 MG PO TABS
2.0000 | ORAL_TABLET | ORAL | Status: DC | PRN
  Administered 2017-07-22 – 2017-07-23 (×3): 2 via ORAL
  Filled 2017-07-22 (×3): qty 2

## 2017-07-22 MED ORDER — MEPERIDINE HCL 50 MG/ML IJ SOLN: 6.2500 mg | INTRAMUSCULAR | Status: DC | PRN

## 2017-07-22 MED ORDER — DEXAMETHASONE SODIUM PHOSPHATE 10 MG/ML IJ SOLN
INTRAMUSCULAR | Status: DC | PRN
  Administered 2017-07-22: 10 mg via INTRAVENOUS

## 2017-07-22 MED ORDER — FENTANYL CITRATE (PF) 100 MCG/2ML IJ SOLN
INTRAMUSCULAR | Status: DC | PRN
  Administered 2017-07-22: 25 ug via INTRAVENOUS
  Administered 2017-07-22: 50 ug via INTRAVENOUS
  Administered 2017-07-22: 25 ug via INTRAVENOUS

## 2017-07-22 MED ORDER — ACETAMINOPHEN 10 MG/ML IV SOLN
INTRAVENOUS | Status: AC
  Filled 2017-07-22: qty 100

## 2017-07-22 MED ORDER — WATER FOR IRRIGATION, STERILE IR SOLN
Status: DC | PRN
  Administered 2017-07-22: 3000 mL

## 2017-07-22 MED ORDER — KETOROLAC TROMETHAMINE 30 MG/ML IJ SOLN
INTRAMUSCULAR | Status: AC
  Filled 2017-07-22: qty 1

## 2017-07-22 MED ORDER — PHENOL 1.4 % MT LIQD: 1.0000 | OROMUCOSAL | Status: DC | PRN

## 2017-07-22 MED ORDER — BUPIVACAINE-EPINEPHRINE (PF) 0.25% -1:200000 IJ SOLN
INTRAMUSCULAR | Status: AC
  Filled 2017-07-22: qty 30

## 2017-07-22 MED ORDER — DOCUSATE SODIUM 100 MG PO CAPS
100.0000 mg | ORAL_CAPSULE | Freq: Two times a day (BID) | ORAL | Status: DC
  Administered 2017-07-22 – 2017-07-23 (×2): 100 mg via ORAL
  Filled 2017-07-22 (×2): qty 1

## 2017-07-22 MED ORDER — KETOROLAC TROMETHAMINE 30 MG/ML IJ SOLN
INTRAMUSCULAR | Status: DC | PRN
  Administered 2017-07-22: 30 mg

## 2017-07-22 MED ORDER — MIDAZOLAM HCL 5 MG/5ML IJ SOLN
INTRAMUSCULAR | Status: DC | PRN
  Administered 2017-07-22: 2 mg via INTRAVENOUS

## 2017-07-22 MED ORDER — CEFAZOLIN SODIUM-DEXTROSE 2-4 GM/100ML-% IV SOLN
2.0000 g | Freq: Four times a day (QID) | INTRAVENOUS | Status: AC
  Administered 2017-07-22 (×2): 2 g via INTRAVENOUS
  Filled 2017-07-22 (×2): qty 100

## 2017-07-22 MED ORDER — SODIUM CHLORIDE 0.9 % IV SOLN
INTRAVENOUS | Status: DC
  Administered 2017-07-22: 14:00:00 via INTRAVENOUS

## 2017-07-22 MED ORDER — KETOROLAC TROMETHAMINE 15 MG/ML IJ SOLN
7.5000 mg | Freq: Four times a day (QID) | INTRAMUSCULAR | Status: DC
  Administered 2017-07-22 – 2017-07-23 (×2): 7.5 mg via INTRAVENOUS
  Filled 2017-07-22 (×2): qty 1

## 2017-07-22 MED ORDER — LACTATED RINGERS IV SOLN: INTRAVENOUS | Status: DC

## 2017-07-22 MED ORDER — BUPIVACAINE-EPINEPHRINE 0.25% -1:200000 IJ SOLN
INTRAMUSCULAR | Status: DC | PRN
  Administered 2017-07-22: 30 mL

## 2017-07-22 MED ORDER — HYDROMORPHONE HCL 1 MG/ML IJ SOLN
0.2500 mg | INTRAMUSCULAR | Status: DC | PRN
  Administered 2017-07-22: 0.5 mg via INTRAVENOUS

## 2017-07-22 MED ORDER — POLYETHYLENE GLYCOL 3350 17 G PO PACK: 17.0000 g | PACK | Freq: Every day | ORAL | Status: DC | PRN

## 2017-07-22 MED ORDER — TRANEXAMIC ACID 1000 MG/10ML IV SOLN
1000.0000 mg | INTRAVENOUS | Status: AC
  Administered 2017-07-22: 1000 mg via INTRAVENOUS
  Filled 2017-07-22: qty 1100

## 2017-07-22 MED ORDER — CEFAZOLIN SODIUM-DEXTROSE 2-4 GM/100ML-% IV SOLN
INTRAVENOUS | Status: AC
  Filled 2017-07-22: qty 100

## 2017-07-22 SURGICAL SUPPLY — 47 items
BAG DECANTER FOR FLEXI CONT (MISCELLANEOUS) IMPLANT
BAG ZIPLOCK 12X15 (MISCELLANEOUS) IMPLANT
CAPT HIP TOTAL 2 ×3 IMPLANT
CHLORAPREP W/TINT 26ML (MISCELLANEOUS) ×3 IMPLANT
CLOTH BEACON ORANGE TIMEOUT ST (SAFETY) IMPLANT
COVER PERINEAL POST (MISCELLANEOUS) ×3 IMPLANT
COVER SURGICAL LIGHT HANDLE (MISCELLANEOUS) ×3 IMPLANT
DECANTER SPIKE VIAL GLASS SM (MISCELLANEOUS) ×3 IMPLANT
DERMABOND ADVANCED (GAUZE/BANDAGES/DRESSINGS) ×2
DERMABOND ADVANCED .7 DNX12 (GAUZE/BANDAGES/DRESSINGS) ×1 IMPLANT
DRAPE SHEET LG 3/4 BI-LAMINATE (DRAPES) ×9 IMPLANT
DRAPE STERI IOBAN 125X83 (DRAPES) ×3 IMPLANT
DRAPE U-SHAPE 47X51 STRL (DRAPES) ×6 IMPLANT
DRSG AQUACEL AG ADV 3.5X10 (GAUZE/BANDAGES/DRESSINGS) ×3 IMPLANT
ELECT PENCIL ROCKER SW 15FT (MISCELLANEOUS) ×3 IMPLANT
ELECT REM PT RETURN 15FT ADLT (MISCELLANEOUS) ×3 IMPLANT
GAUZE SPONGE 4X4 12PLY STRL (GAUZE/BANDAGES/DRESSINGS) ×3 IMPLANT
GLOVE BIO SURGEON STRL SZ8.5 (GLOVE) ×6 IMPLANT
GLOVE BIOGEL M STRL SZ7.5 (GLOVE) ×3 IMPLANT
GLOVE BIOGEL PI IND STRL 7.5 (GLOVE) ×7 IMPLANT
GLOVE BIOGEL PI IND STRL 8.5 (GLOVE) ×1 IMPLANT
GLOVE BIOGEL PI INDICATOR 7.5 (GLOVE) ×14
GLOVE BIOGEL PI INDICATOR 8.5 (GLOVE) ×2
GOWN SPEC L3 XXLG W/TWL (GOWN DISPOSABLE) ×3 IMPLANT
GOWN STRL REUS W/TWL XL LVL3 (GOWN DISPOSABLE) ×9 IMPLANT
GUIDEWIRE BALL NOSE 100CM (WIRE) ×3 IMPLANT
HANDPIECE INTERPULSE COAX TIP (DISPOSABLE) ×2
HOLDER FOLEY CATH W/STRAP (MISCELLANEOUS) ×3 IMPLANT
HOOD PEEL AWAY FLYTE STAYCOOL (MISCELLANEOUS) ×9 IMPLANT
MARKER SKIN DUAL TIP RULER LAB (MISCELLANEOUS) ×3 IMPLANT
NEEDLE SPNL 18GX3.5 QUINCKE PK (NEEDLE) ×3 IMPLANT
PACK ANTERIOR HIP CUSTOM (KITS) ×3 IMPLANT
SAW OSC TIP CART 19.5X105X1.3 (SAW) ×3 IMPLANT
SEALER BIPOLAR AQUA 6.0 (INSTRUMENTS) ×3 IMPLANT
SET HNDPC FAN SPRY TIP SCT (DISPOSABLE) ×1 IMPLANT
SUT ETHIBOND NAB CT1 #1 30IN (SUTURE) ×6 IMPLANT
SUT MNCRL AB 3-0 PS2 18 (SUTURE) ×3 IMPLANT
SUT MON AB 2-0 CT1 36 (SUTURE) ×6 IMPLANT
SUT STRATAFIX PDO 1 14 VIOLET (SUTURE) ×2
SUT STRATFX PDO 1 14 VIOLET (SUTURE) ×1
SUT VIC AB 2-0 CT1 27 (SUTURE) ×2
SUT VIC AB 2-0 CT1 TAPERPNT 27 (SUTURE) ×1 IMPLANT
SUTURE STRATFX PDO 1 14 VIOLET (SUTURE) ×1 IMPLANT
SYR 50ML LL SCALE MARK (SYRINGE) IMPLANT
TRAY FOLEY W/METER SILVER 16FR (SET/KITS/TRAYS/PACK) IMPLANT
WATER STERILE IRR 1500ML POUR (IV SOLUTION) ×6 IMPLANT
YANKAUER SUCT BULB TIP 10FT TU (MISCELLANEOUS) ×3 IMPLANT

## 2017-07-22 NOTE — H&P (View-Only) (Signed)
TOTAL HIP ADMISSION H&P  Patient is admitted for left total hip arthroplasty.  Subjective:  Chief Complaint: left hip pain  HPI: James Arroyo, 56 y.o. male, has a history of pain and functional disability in the left hip(s) due to arthritis and patient has failed non-surgical conservative treatments for greater than 12 weeks to include NSAID's and/or analgesics, flexibility and strengthening excercises, weight reduction as appropriate and activity modification.  Onset of symptoms was gradual starting 5 years ago with rapidlly worsening course since that time.The patient noted no past surgery on the left hip(s).  Patient currently rates pain in the left hip at 10 out of 10 with activity. Patient has night pain, worsening of pain with activity and weight bearing, trendelenberg gait, pain that interfers with activities of daily living, pain with passive range of motion and crepitus. Patient has evidence of subchondral cysts, subchondral sclerosis, periarticular osteophytes and joint space narrowing by imaging studies. This condition presents safety issues increasing the risk of falls. There is no current active infection.  Patient Active Problem List   Diagnosis Date Noted  . Osteoarthritis of right hip 06/03/2017  . Alcohol dependence ([place]) 09/09/2015  . Hepatitis B 07/30/2015  . Hemorrhoids 07/30/2015   Past Medical History:  Diagnosis Date  . Hemorrhoids     Past Surgical History:  Procedure Laterality Date  . cyst eyelids    . TONSILLECTOMY    . TOTAL HIP ARTHROPLASTY Right 06/03/2017   Procedure: RIGHT TOTAL HIP ARTHROPLASTY ANTERIOR APPROACH;  Surgeon: Rod Can, MD;  Location: [facility];  Service: Orthopedics;  Laterality: Right;  Needs RNFA    Current Outpatient Prescriptions  Medication Sig Dispense Refill Last Dose  . aspirin 81 MG chewable tablet Chew 1 tablet (81 mg total) by mouth 2 (two) times daily. (Patient not taking: Reported on 07/16/2017) 60 tablet 1 Not  Taking at Unknown time  . docusate sodium (COLACE) 100 MG capsule Take 1 capsule (100 mg total) by mouth 2 (two) times daily. (Patient not taking: Reported on 07/09/2017) 60 capsule 1 Not Taking at Unknown time  . HYDROcodone-acetaminophen (NORCO/VICODIN) 5-325 MG tablet Take 1-2 tablets by mouth every 4 (four) hours as needed (breakthrough pain). 60 tablet 0   . meloxicam (MOBIC) 15 MG tablet Take 15 mg by mouth at bedtime.    Past Month at Unknown time  . ondansetron (ZOFRAN) 4 MG tablet Take 1 tablet (4 mg total) by mouth every 6 (six) hours as needed for nausea. (Patient not taking: Reported on 07/09/2017) 20 tablet 0 Not Taking at Unknown time  . senna (SENOKOT) 8.6 MG TABS tablet Take 2 tablets (17.2 mg total) by mouth at bedtime. (Patient not taking: Reported on 07/09/2017) 120 each 0 Not Taking at Unknown time   No current facility-administered medications for this visit.    No Known Allergies  Social History  Substance Use Topics  . Smoking status: Former Smoker    Quit date: 1994  . Smokeless tobacco: Never Used  . Alcohol use No     Comment: Recovering alcoholic    Family History  Problem Relation Age of Onset  . Cancer Mother   . Kidney disease Father   . Hypertension Father   . Colon cancer Neg Hx   . Pancreatic cancer Neg Hx   . Stomach cancer Neg Hx      Review of Systems  Constitutional: Negative.   HENT: Positive for tinnitus.   Eyes: Negative.   Respiratory: Negative.   Cardiovascular: Negative.  Gastrointestinal: Negative.   Genitourinary: Negative.   Musculoskeletal: Positive for back pain.  Skin: Positive for itching.  Neurological: Negative.   Endo/Heme/Allergies: Negative.   Psychiatric/Behavioral: The patient has insomnia.     Objective:  Physical Exam  Vitals reviewed. Constitutional: He is oriented to person, place, and time. He appears well-developed and well-nourished.  HENT:  Head: Normocephalic and atraumatic.  Eyes: Pupils are equal,  round, and reactive to light. Conjunctivae and EOM are normal.  Neck: Normal range of motion. Neck supple.  Cardiovascular: Normal rate, regular rhythm and intact distal pulses.   Respiratory: Effort normal. No respiratory distress.  GI: He exhibits no distension.  Genitourinary:  Genitourinary Comments: deferred  Musculoskeletal:       Left hip: He exhibits decreased range of motion and bony tenderness.  Neurological: He is alert and oriented to person, place, and time. He has normal reflexes.  Skin: Skin is warm and dry.  Psychiatric: He has a normal mood and affect. His behavior is normal. Judgment and thought content normal.    Vital signs in last 24 hours: @VSRANGES @  Labs:   Estimated body mass index is 24.9 kg/m as calculated from the following:   Height as of an earlier encounter on 07/16/17: 5\' 6"  (1.676 m).   Weight as of an earlier encounter on 07/16/17: 70 kg (154 lb 4 oz).   Imaging Review Plain radiographs demonstrate severe degenerative joint disease of the left hip(s). The bone quality appears to be adequate for age and reported activity level.  Assessment/Plan:  End stage arthritis, left hip(s)  The patient history, physical examination, clinical judgement of the provider and imaging studies are consistent with end stage degenerative joint disease of the left hip(s) and total hip arthroplasty is deemed medically necessary. The treatment options including medical management, injection therapy, arthroscopy and arthroplasty were discussed at length. The risks and benefits of total hip arthroplasty were presented and reviewed. The risks due to aseptic loosening, infection, stiffness, dislocation/subluxation,  thromboembolic complications and other imponderables were discussed.  The patient acknowledged the explanation, agreed to proceed with the plan and consent was signed. Patient is being admitted for inpatient treatment for surgery, pain control, PT, OT, prophylactic  antibiotics, VTE prophylaxis, progressive ambulation and ADL's and discharge planning.The patient is planning to be discharged home with HEP

## 2017-07-22 NOTE — Transfer of Care (Signed)
Immediate Anesthesia Transfer of Care Note  Patient: James Arroyo  Procedure(s) Performed: LEFT TOTAL HIP ARTHROPLASTY ANTERIOR APPROACH (Left Hip)  Patient Location: PACU  Anesthesia Type:General  Level of Consciousness: awake, alert , oriented and patient cooperative  Airway & Oxygen Therapy: Patient Spontanous Breathing and Patient connected to face mask oxygen  Post-op Assessment: Report given to RN and Post -op Vital signs reviewed and stable  Post vital signs: Reviewed and stable  Last Vitals:  Vitals:   07/22/17 0554  BP: 120/68  Pulse: 66  Resp: 18  Temp: 36.9 C  SpO2: 100%    Last Pain:  Vitals:   07/22/17 0554  TempSrc: Oral      Patients Stated Pain Goal: 4 (05/39/76 7341)  Complications: No apparent anesthesia complications

## 2017-07-22 NOTE — Anesthesia Preprocedure Evaluation (Addendum)
Anesthesia Evaluation  Patient identified by MRN, date of birth, ID band Patient awake    Reviewed: Allergy & Precautions, NPO status , Patient's Chart, lab work & pertinent test results  Airway Mallampati: I  TM Distance: >3 FB Neck ROM: Full    Dental   Pulmonary former smoker,    Pulmonary exam normal        Cardiovascular Normal cardiovascular exam     Neuro/Psych    GI/Hepatic (+) Hepatitis -, B  Endo/Other    Renal/GU      Musculoskeletal   Abdominal   Peds  Hematology   Anesthesia Other Findings   Reproductive/Obstetrics                            Anesthesia Physical Anesthesia Plan  ASA: II  Anesthesia Plan: Spinal   Post-op Pain Management:    Induction: Intravenous  PONV Risk Score and Plan: 1 and Ondansetron, Dexamethasone and Treatment may vary due to age or medical condition  Airway Management Planned: Simple Face Mask  Additional Equipment:   Intra-op Plan:   Post-operative Plan:   Informed Consent: I have reviewed the patients History and Physical, chart, labs and discussed the procedure including the risks, benefits and alternatives for the proposed anesthesia with the patient or authorized representative who has indicated his/her understanding and acceptance.     Plan Discussed with: CRNA and Surgeon  Anesthesia Plan Comments:         Anesthesia Quick Evaluation

## 2017-07-22 NOTE — Anesthesia Procedure Notes (Signed)
Spinal  Patient location during procedure: OR Start time: 07/22/2017 7:30 AM End time: 07/22/2017 7:34 AM Staffing Anesthesiologist: Lillia Abed Performed: anesthesiologist  Preanesthetic Checklist Completed: patient identified, surgical consent, pre-op evaluation, timeout performed, IV checked, risks and benefits discussed and monitors and equipment checked Spinal Block Patient position: sitting Prep: DuraPrep Patient monitoring: cardiac monitor, continuous pulse ox and blood pressure Approach: right paramedian Location: L3-4 Injection technique: single-shot Needle Needle type: Pencan  Needle gauge: 24 G Needle length: 9 cm Needle insertion depth: 6 cm

## 2017-07-22 NOTE — Discharge Instructions (Signed)
°Dr. Kolsen Choe °Joint Replacement Specialist °[facility] °[address] °[place], [place] [address] °[phone] ° ° °TOTAL HIP REPLACEMENT POSTOPERATIVE DIRECTIONS ° ° ° °Hip Rehabilitation, Guidelines Following Surgery  ° °WEIGHT BEARING °Weight bearing as tolerated with assist device (walker, cane, etc) as directed, use it as long as suggested by your surgeon or therapist, typically at least 4-6 weeks. ° °The results of a hip operation are greatly improved after range of motion and muscle strengthening exercises. Follow all safety measures which are given to protect your hip. If any of these exercises cause increased pain or swelling in your joint, decrease the amount until you are comfortable again. Then slowly increase the exercises. Call your caregiver if you have problems or questions.  ° °HOME CARE INSTRUCTIONS  °Most of the following instructions are designed to prevent the dislocation of your new hip.  °Remove items at home which could result in a fall. This includes throw rugs or furniture in walking pathways.  °Continue medications as instructed at time of discharge. °· You may have some home medications which will be placed on hold until you complete the course of blood thinner medication. °· You may start showering once you are discharged home. Do not remove your dressing. °Do not put on socks or shoes without following the instructions of your caregivers.   °Sit on chairs with arms. Use the chair arms to help push yourself up when arising.  °Arrange for the use of a toilet seat elevator so you are not sitting low.  °· Walk with walker as instructed.  °You may resume a sexual relationship in one month or when given the OK by your caregiver.  °Use walker as long as suggested by your caregivers.  °You may put full weight on your legs and walk as much as is comfortable. °Avoid periods of inactivity such as sitting longer than an hour when not asleep. This helps prevent  blood clots.  °You may return to work once you are cleared by your surgeon.  °Do not drive a car for 6 weeks or until released by your surgeon.  °Do not drive while taking narcotics.  °Wear elastic stockings for two weeks following surgery during the day but you may remove then at night.  °Make sure you keep all of your appointments after your operation with all of your doctors and caregivers. You should call the office at the above phone number and make an appointment for approximately two weeks after the date of your surgery. °Please pick up a stool softener and laxative for home use as long as you are requiring pain medications. °· ICE to the affected hip every three hours for 30 minutes at a time and then as needed for pain and swelling. Continue to use ice on the hip for pain and swelling from surgery. You may notice swelling that will progress down to the foot and ankle.  This is normal after surgery.  Elevate the leg when you are not up walking on it.   °It is important for you to complete the blood thinner medication as prescribed by your doctor. °· Continue to use the breathing machine which will help keep your temperature down.  It is common for your temperature to cycle up and down following surgery, especially at night when you are not up moving around and exerting yourself.  The breathing machine keeps your lungs expanded and your temperature down. ° °RANGE OF MOTION AND STRENGTHENING EXERCISES  °These exercises are   designed to help you keep full movement of your hip joint. Follow your caregiver's or physical therapist's instructions. Perform all exercises about fifteen times, three times per day or as directed. Exercise both hips, even if you have had only one joint replacement. These exercises can be done on a training (exercise) mat, on the floor, on a table or on a bed. Use whatever works the best and is most comfortable for you. Use music or television while you are exercising so that the exercises  are a pleasant break in your day. This will make your life better with the exercises acting as a break in routine you can look forward to.  °Lying on your back, slowly slide your foot toward your buttocks, raising your knee up off the floor. Then slowly slide your foot back down until your leg is straight again.  °Lying on your back spread your legs as far apart as you can without causing discomfort.  °Lying on your side, raise your upper leg and foot straight up from the floor as far as is comfortable. Slowly lower the leg and repeat.  °Lying on your back, tighten up the muscle in the front of your thigh (quadriceps muscles). You can do this by keeping your leg straight and trying to raise your heel off the floor. This helps strengthen the largest muscle supporting your knee.  °Lying on your back, tighten up the muscles of your buttocks both with the legs straight and with the knee bent at a comfortable angle while keeping your heel on the floor.  ° °SKILLED REHAB INSTRUCTIONS: °If the patient is transferred to a skilled rehab facility following release from the hospital, a list of the current medications will be sent to the facility for the patient to continue.  When discharged from the skilled rehab facility, please have the facility set up the patient's Home Health Physical Therapy prior to being released. Also, the skilled facility will be responsible for providing the patient with their medications at time of release from the facility to include their pain medication and their blood thinner medication. If the patient is still at the rehab facility at time of the two week follow up appointment, the skilled rehab facility will also need to assist the patient in arranging follow up appointment in our office and any transportation needs. ° °MAKE SURE YOU:  °Understand these instructions.  °Will watch your condition.  °Will get help right away if you are not doing well or get worse. ° °Pick up stool softner and  laxative for home use following surgery while on pain medications. °Do not remove your dressing. °The dressing is waterproof--it is OK to take showers. °Continue to use ice for pain and swelling after surgery. °Do not use any lotions or creams on the incision until instructed by your surgeon. °Total Hip Protocol. ° ° °

## 2017-07-22 NOTE — Anesthesia Postprocedure Evaluation (Signed)
Anesthesia Post Note  Patient: Steadman Prosperi  Procedure(s) Performed: LEFT TOTAL HIP ARTHROPLASTY ANTERIOR APPROACH (Left Hip)     Patient location during evaluation: PACU Anesthesia Type: Spinal Level of consciousness: awake and alert Pain management: pain level controlled Vital Signs Assessment: post-procedure vital signs reviewed and stable Respiratory status: spontaneous breathing, nonlabored ventilation, respiratory function stable and patient connected to nasal cannula oxygen Cardiovascular status: blood pressure returned to baseline and stable Postop Assessment: no apparent nausea or vomiting Anesthetic complications: no    Last Vitals:  Vitals:   07/22/17 1230 07/22/17 1318  BP: 112/77 122/70  Pulse: (!) 57 65  Resp: 14 16  Temp: 36.5 C 36.6 C  SpO2: 99% 100%    Last Pain:  Vitals:   07/22/17 1318  TempSrc: Oral  PainSc:                  Mry Lamia DAVID

## 2017-07-22 NOTE — Progress Notes (Signed)
Dr. Conrad Kennerdell made aware of spinal level- L3- able to rock both knees- not moving feet-complains of tingling in right hand fingers- color adequate- can tell they are being touched- radial pulse strong-O.K. To go to floor

## 2017-07-22 NOTE — Op Note (Signed)
OPERATIVE REPORT  SURGEON: Rod Can, MD   ASSISTANT: Nehemiah Massed, PA-C.  PREOPERATIVE DIAGNOSIS: Left hip arthritis.   POSTOPERATIVE DIAGNOSIS: Left hip arthritis.   PROCEDURE: Left total hip arthroplasty, anterior approach.   IMPLANTS: DePuy Tri Lock stem, size 5, hi offset. DePuy Pinnacle Cup, size 52 mm. DePuy Altrx liner, size 32 by 52 mm, +4 neutral. DePuy Biolox ceramic head ball, size 32 + 5 mm.  ANESTHESIA:  General and Spinal  ESTIMATED BLOOD LOSS:-500 mL    ANTIBIOTICS: 2 g Ancef.  DRAINS: None.  COMPLICATIONS: None.   CONDITION: PACU - hemodynamically stable.   BRIEF CLINICAL NOTE: James Arroyo is a 56 y.o. male with a long-standing history of Left hip arthritis. After failing conservative management, the patient was indicated for total hip arthroplasty. The risks, benefits, and alternatives to the procedure were explained, and the patient elected to proceed.  PROCEDURE IN DETAIL: Surgical site was marked by myself in the pre-op holding area. Once inside the operating room, spinal anesthesia was obtained, and a foley catheter was inserted. The patient was then positioned on the Hana table. All bony prominences were well padded. The hip was prepped and draped in the normal sterile surgical fashion. A time-out was called verifying side and site of surgery. The patient received IV antibiotics within 60 minutes of beginning the procedure.  The direct anterior approach to the hip was performed through the Hueter interval. Lateral femoral circumflex vessels were treated with the Auqumantys. The anterior capsule was exposed and an inverted T capsulotomy was made.The femoral neck cut was made to the level of the templated cut. A corkscrew was placed into the head and the head was removed. The femoral head was found to have eburnated bone. The head was passed to the back table and was measured.  Acetabular exposure was achieved, and the pulvinar  and labrum were excised. Sequential reaming of the acetabulum was then performed up to a size 51 mm reamer. A 52 mm cup was then opened and impacted into place at approximately 40 degrees of abduction and 20 degrees of anteversion. The final polyethylene liner was impacted into place and acetabular osteophytes were removed.   I then gained femoral exposure taking care to protect the abductors and greater trochanter. This was performed using standard external rotation, extension, and adduction. The capsule was peeled off the inner aspect of the greater trochanter, taking care to preserve the short external rotators. A cookie cutter was used to enter the femoral canal, and then the femoral canal finder was placed. As broaching was performed, the broach was engaging distally.  Therefore, I performed sequential flexible reaming in order to open the canal distally.  Then, sequential broaching was performed up to a size 5. Calcar planer was used on the femoral neck remnant. I placed a hi offset neck and a trial head ball. The hip was reduced. Leg lengths and offset were checked fluoroscopically. The hip was dislocated and trial components were removed. The final implants were placed, and the hip was reduced.  Fluoroscopy was used to confirm component position and leg lengths. At 90 degrees of external rotation and full extension, the hip was stable to an anterior directed force.  The wound was copiously irrigated with normal saline using pulse lavage. Marcaine solution was injected into the periarticular soft tissue. The wound was closed in layers using #1 Vicryl and V-Loc for the fascia, 2-0 Vicryl for the subcutaneous fat, 2-0 Monocryl for the deep dermal layer, 3-0 running  Monocryl subcuticular stitch, and Dermabond for the skin. Once the glue was fully dried, an Aquacell Ag dressing was applied. The patient was transported to the recovery room in stable condition. Sponge, needle, and instrument  counts were correct at the end of the case x2. The patient tolerated the procedure well and there were no known complications.  Please note that a surgical assistant was a medical necessity for this procedure to perform it in a safe and expeditious manner. Assistant was necessary to provide appropriate retraction of vital neurovascular structures, to prevent femoral fracture, and to allow for anatomic placement of the prosthesis.

## 2017-07-22 NOTE — Anesthesia Procedure Notes (Signed)
Procedure Name: LMA Insertion Date/Time: 07/22/2017 8:13 AM Performed by: Carleene Cooper A Pre-anesthesia Checklist: Patient identified, Emergency Drugs available, Suction available, Patient being monitored and Timeout performed Patient Re-evaluated:Patient Re-evaluated prior to induction Oxygen Delivery Method: Circle system utilized Preoxygenation: Pre-oxygenation with 100% oxygen Induction Type: IV induction Ventilation: Mask ventilation without difficulty LMA: LMA with gastric port inserted LMA Size: 4.0 Number of attempts: 1 Placement Confirmation: positive ETCO2 and breath sounds checked- equal and bilateral Tube secured with: Tape Dental Injury: Teeth and Oropharynx as per pre-operative assessment

## 2017-07-22 NOTE — Progress Notes (Signed)
Portable AP Pelvis X-ray done. 

## 2017-07-22 NOTE — Interval H&P Note (Signed)
History and Physical Interval Note:  07/22/2017 7:24 AM  James Arroyo  has presented today for surgery, with the diagnosis of Degenerative joint disease left hip  The various methods of treatment have been discussed with the patient and family. After consideration of risks, benefits and other options for treatment, the patient has consented to  Procedure(s) with comments: LEFT TOTAL HIP ARTHROPLASTY ANTERIOR APPROACH (Left) - Needs RNFA as a surgical intervention .  The patient's history has been reviewed, patient examined, no change in status, stable for surgery.  I have reviewed the patient's chart and labs.  Questions were answered to the patient's satisfaction.     Taher Vannote, Horald Pollen

## 2017-07-22 NOTE — Progress Notes (Signed)
X-ray results noted 

## 2017-07-22 NOTE — Evaluation (Signed)
Physical Therapy Evaluation Patient Details Name: James Arroyo MRN: [id number] DOB: 29-Nov-1960 Today's Date: 07/22/2017   History of Present Illness  Pt s/p L THR and with hx of recent R THR  Clinical Impression  Pt s/p L THR and presents with decreased L LE strength/ROM and post op pain limiting functional mobility.  Pt should progress to dc home with family assist.    Follow Up Recommendations DC plan and follow up therapy as arranged by surgeon (Pt states HEP)    Equipment Recommendations  None recommended by PT    Recommendations for Other Services       Precautions / Restrictions Precautions Precautions: Fall Restrictions Weight Bearing Restrictions: No Other Position/Activity Restrictions: WBAT      Mobility  Bed Mobility Overal bed mobility: Needs Assistance Bed Mobility: Supine to Sit     Supine to sit: Min assist     General bed mobility comments: cues for sequence and use of R LE to self assist  Transfers Overall transfer level: Needs assistance Equipment used: Rolling walker (2 wheeled) Transfers: Sit to/from Stand Sit to Stand: Min assist         General transfer comment: cues for LE management and use of UES to self assist  Ambulation/Gait Ambulation/Gait assistance: Min assist Ambulation Distance (Feet): 64 Feet Assistive device: Rolling walker (2 wheeled) Gait Pattern/deviations: Step-to pattern;Step-through pattern;Decreased step length - right;Decreased step length - left;Shuffle;Trunk flexed Gait velocity: decr Gait velocity interpretation: Below normal speed for age/gender General Gait Details: cues for sequence, posture and position from [employer]            Wheelchair Mobility    Modified Rankin (Stroke Patients Only)       Balance Overall balance assessment: Needs assistance Sitting-balance support: Feet supported;No upper extremity supported Sitting balance-Leahy Scale: Good     Standing balance support:  Bilateral upper extremity supported Standing balance-Leahy Scale: Poor                               Pertinent Vitals/Pain Pain Assessment: 0-10 Pain Score: 2  Pain Location: L hip Pain Descriptors / Indicators: Aching;Sore Pain Intervention(s): Limited activity within patient's tolerance;Monitored during session;Premedicated before session;Ice applied    Home Living Family/patient expects to be discharged to:: Private residence Living Arrangements: Spouse/significant other Available Help at Discharge: Family Type of Home: House Home Access: Stairs to enter Entrance Stairs-Rails: Right Entrance Stairs-Number of Steps: 4 Home Layout: Two level Home Equipment: Environmental consultant - 2 wheels;Cane - single point      Prior Function Level of Independence: Independent               Hand Dominance        Extremity/Trunk Assessment   Upper Extremity Assessment Upper Extremity Assessment: Overall WFL for tasks assessed    Lower Extremity Assessment Lower Extremity Assessment: LLE deficits/detail LLE Deficits / Details: Strength at hip 2/5 with AAROM at hip to 80 flex and 15 abd    Cervical / Trunk Assessment Cervical / Trunk Assessment: Normal  Communication   Communication: No difficulties  Cognition Arousal/Alertness: Awake/alert Behavior During Therapy: WFL for tasks assessed/performed Overall Cognitive Status: Within Functional Limits for tasks assessed                                        General Comments  Exercises Total Joint Exercises Ankle Circles/Pumps: AROM;Both;15 reps;Supine Heel Slides: AAROM;Left;20 reps;Supine Hip ABduction/ADduction: AAROM;Left;15 reps;Supine   Assessment/Plan    PT Assessment Patient needs continued PT services  PT Problem List Decreased strength;Decreased range of motion;Decreased activity tolerance;Decreased balance;Decreased mobility;Decreased knowledge of use of DME;Pain       PT Treatment  Interventions DME instruction;Gait training;Stair training;Functional mobility training;Therapeutic activities;Therapeutic exercise;Patient/family education    PT Goals (Current goals can be found in the Care Plan section)  Acute Rehab PT Goals Patient Stated Goal: Have both legs the same length PT Goal Formulation: With patient Time For Goal Achievement: 07/26/17 Potential to Achieve Goals: Good    Frequency 7X/week   Barriers to discharge        Co-evaluation               AM-PAC PT "6 Clicks" Daily Activity  Outcome Measure Difficulty turning over in bed (including adjusting bedclothes, sheets and blankets)?: Unable Difficulty moving from lying on back to sitting on the side of the bed? : Unable Difficulty sitting down on and standing up from a chair with arms (e.g., wheelchair, bedside commode, etc,.)?: Unable Help needed moving to and from a bed to chair (including a wheelchair)?: A Little Help needed walking in hospital room?: A Little Help needed climbing 3-5 steps with a railing? : A Lot 6 Click Score: 11    End of Session Equipment Utilized During Treatment: Gait belt Activity Tolerance: Patient tolerated treatment well;Patient limited by fatigue Patient left: in bed;with call bell/phone within reach;with family/visitor present Nurse Communication: Mobility status PT Visit Diagnosis: Difficulty in walking, not elsewhere classified (R26.2)    Time: 1521-1550 PT Time Calculation (min) (ACUTE ONLY): 29 min   Charges:   PT Evaluation $PT Eval Low Complexity: 1 Low PT Treatments $Gait Training: 8-22 mins   PT G Codes:        Pg [phone]   Newt Levingston 07/22/2017, 5:07 PM

## 2017-07-23 LAB — CBC
HCT: 30.3 % — ABNORMAL LOW (ref 39.0–52.0)
Hemoglobin: 10.3 g/dL — ABNORMAL LOW (ref 13.0–17.0)
MCH: 33.4 pg (ref 26.0–34.0)
MCHC: 34 g/dL (ref 30.0–36.0)
MCV: 98.4 fL (ref 78.0–100.0)
PLATELETS: 215 10*3/uL (ref 150–400)
RBC: 3.08 MIL/uL — ABNORMAL LOW (ref 4.22–5.81)
RDW: 13.3 % (ref 11.5–15.5)
WBC: 8.1 10*3/uL (ref 4.0–10.5)

## 2017-07-23 LAB — BASIC METABOLIC PANEL
Anion gap: 7 (ref 5–15)
BUN: 8 mg/dL (ref 6–20)
CO2: 24 mmol/L (ref 22–32)
CREATININE: 0.83 mg/dL (ref 0.61–1.24)
Calcium: 8.4 mg/dL — ABNORMAL LOW (ref 8.9–10.3)
Chloride: 108 mmol/L (ref 101–111)
GFR calc Af Amer: >60 mL/min (ref >60)
Glucose, Bld: 137 mg/dL — ABNORMAL HIGH (ref 65–99)
Potassium: 4.2 mmol/L (ref 3.5–5.1)
SODIUM: 139 mmol/L (ref 135–145)

## 2017-07-23 MED ORDER — ASPIRIN 81 MG PO CHEW: 81.0000 mg | CHEWABLE_TABLET | Freq: Two times a day (BID) | ORAL | 1 refills | Status: AC

## 2017-07-23 MED ORDER — ONDANSETRON HCL 4 MG PO TABS: 4.0000 mg | ORAL_TABLET | Freq: Four times a day (QID) | ORAL | 0 refills | Status: AC | PRN

## 2017-07-23 MED ORDER — HYDROCODONE-ACETAMINOPHEN 5-325 MG PO TABS: 1.0000 | ORAL_TABLET | Freq: Four times a day (QID) | ORAL | 0 refills | Status: AC | PRN

## 2017-07-23 NOTE — Progress Notes (Signed)
   Subjective:  Patient reports pain as mild to moderate.  Denies N/V/CP/SOB.  Objective:   VITALS:   Vitals:   07/22/17 1500 07/22/17 2109 07/23/17 0106 07/23/17 0504  BP:  106/60 (!) 93/57 (!) 92/55  Pulse: 60 71 82 65  Resp: 16 13 15 14   Temp: 97.6 F (36.4 C) 97.9 F (36.6 C) 97.9 F (36.6 C) 98.1 F (36.7 C)  TempSrc: Oral Oral Oral Oral  SpO2: 100% 100% 100% 100%  Weight:      Height:        NAD ABD soft Sensation intact distally Intact pulses distally Dorsiflexion/Plantar flexion intact Incision: dressing C/D/I Compartment soft   Lab Results  Component Value Date   WBC 8.1 07/23/2017   HGB 10.3 (L) 07/23/2017   HCT 30.3 (L) 07/23/2017   MCV 98.4 07/23/2017   PLT 215 07/23/2017   BMET    Component Value Date/Time   NA 139 07/23/2017 0540   K 4.2 07/23/2017 0540   CL 108 07/23/2017 0540   CO2 24 07/23/2017 0540   GLUCOSE 137 (H) 07/23/2017 0540   BUN 8 07/23/2017 0540   CREATININE 0.83 07/23/2017 0540   CREATININE 0.95 02/25/2017 1023   CALCIUM 8.4 (L) 07/23/2017 0540   GFRNONAA >60 07/23/2017 0540   GFRAA >60 07/23/2017 0540     Assessment/Plan: 1 Day Post-Op   Principal Problem:   Osteoarthritis of left hip   WBAT with walker PT/OT ASA, SCDs, TEDs PO pain control D/C home with HEP   James Arroyo, Horald Pollen 07/23/2017, 8:00 AM   Rod Can, MD Cell [phone]

## 2017-07-23 NOTE — Progress Notes (Signed)
Discharge planning, no HH needs identified. [phone] 

## 2017-07-23 NOTE — Progress Notes (Signed)
Physical Therapy Treatment Patient Details Name: James Arroyo MRN: [id number] DOB: [birth year] Today's Date: 07/23/2017    History of Present Illness Pt s/p L THR and with hx of recent R THR    PT Comments    Pt motivated and progressing well with mobility.  Reviewed stairs, car transfers and home therex with progression and with written instruction provided.   Follow Up Recommendations  DC plan and follow up therapy as arranged by surgeon     Equipment Recommendations  None recommended by PT    Recommendations for Other Services       Precautions / Restrictions Precautions Precautions: Fall Restrictions Weight Bearing Restrictions: No Other Position/Activity Restrictions: WBAT    Mobility  Bed Mobility Overal bed mobility: Needs Assistance Bed Mobility: Sit to Supine       Sit to supine: Min guard   General bed mobility comments: cues for sequence and use of R LE to self assist  Transfers Overall transfer level: Needs assistance Equipment used: Rolling walker (2 wheeled) Transfers: Sit to/from Stand Sit to Stand: Min guard;Supervision         General transfer comment: cues for LE management and use of UES to self assist  Ambulation/Gait Ambulation/Gait assistance: Min guard;Supervision Ambulation Distance (Feet): 200 Feet Assistive device: Rolling walker (2 wheeled) Gait Pattern/deviations: Step-to pattern;Step-through pattern;Decreased step length - right;Decreased step length - left;Shuffle;Trunk flexed Gait velocity: decr Gait velocity interpretation: Below normal speed for age/gender General Gait Details: min cues for sequence, posture and position from RW   Stairs Stairs: Yes   Stair Management: One rail Left;Step to pattern;Forwards;With cane Number of Stairs: 2 General stair comments: min cues for sequence  Wheelchair Mobility    Modified Rankin (Stroke Patients Only)       Balance Overall balance assessment: No apparent  balance deficits (not formally assessed)                                          Cognition Arousal/Alertness: Awake/alert Behavior During Therapy: WFL for tasks assessed/performed Overall Cognitive Status: Within Functional Limits for tasks assessed                                        Exercises Total Joint Exercises Ankle Circles/Pumps: AROM;Both;15 reps;Supine Quad Sets: AROM;Both;10 reps;Supine Heel Slides: AAROM;Left;20 reps;Supine Hip ABduction/ADduction: AAROM;Left;15 reps;Supine Long Arc Quad: Left;AROM;10 reps;Seated    General Comments        Pertinent Vitals/Pain Pain Assessment: 0-10 Pain Score: 3  Pain Location: L hip Pain Descriptors / Indicators: Aching;Sore Pain Intervention(s): Limited activity within patient's tolerance;Monitored during session;Premedicated before session;Ice applied    Home Living                      Prior Function            PT Goals (current goals can now be found in the care plan section) Acute Rehab PT Goals Patient Stated Goal: Have both legs the same length PT Goal Formulation: With patient Time For Goal Achievement: 07/26/17 Potential to Achieve Goals: Good Progress towards PT goals: Progressing toward goals    Frequency    7X/week      PT Plan Current plan remains appropriate    Co-evaluation  AM-PAC PT "6 Clicks" Daily Activity  Outcome Measure  Difficulty turning over in bed (including adjusting bedclothes, sheets and blankets)?: A Lot Difficulty moving from lying on back to sitting on the side of the bed? : A Lot Difficulty sitting down on and standing up from a chair with arms (e.g., wheelchair, bedside commode, etc,.)?: A Lot Help needed moving to and from a bed to chair (including a wheelchair)?: A Little Help needed walking in hospital room?: A Little Help needed climbing 3-5 steps with a railing? : A Little 6 Click Score: 15    End of  Session Equipment Utilized During Treatment: Gait belt Activity Tolerance: Patient tolerated treatment well Patient left: in bed;with call bell/phone within reach;with family/visitor present Nurse Communication: Mobility status PT Visit Diagnosis: Difficulty in walking, not elsewhere classified (R26.2)     Time: 3235-5732 PT Time Calculation (min) (ACUTE ONLY): 48 min  Charges:  $Gait Training: 8-22 mins $Therapeutic Exercise: 8-22 mins $Therapeutic Activity: 8-22 mins                    G Codes:       Pg [phone]    Bane Hagy 07/23/2017, 12:13 PM

## 2017-07-23 NOTE — Discharge Summary (Signed)
Physician Discharge Summary  Patient ID: James Arroyo MRN: [id number] DOB/AGE: 57-Apr-1962 56 y.o.  Admit date: 07/22/2017 Discharge date: 07/23/2017  Admission Diagnoses:  Osteoarthritis of left hip  Discharge Diagnoses:  Principal Problem:   Osteoarthritis of left hip   Past Medical History:  Diagnosis Date  . Hemorrhoids     Surgeries: Procedure(s): LEFT TOTAL HIP ARTHROPLASTY ANTERIOR APPROACH on 07/22/2017   Consultants (if any):   Discharged Condition: Improved  Hospital Course: James Arroyo is an 56 y.o. male who was admitted 07/22/2017 with a diagnosis of Osteoarthritis of left hip and went to the operating room on 07/22/2017 and underwent the above named procedures.    He was given perioperative antibiotics:  Anti-infectives    Start     Dose/Rate Route Frequency Ordered Stop   07/22/17 1400  ceFAZolin (ANCEF) IVPB 2g/100 mL premix     2 g 200 mL/hr over 30 Minutes Intravenous Every 6 hours 07/22/17 1230 07/22/17 2021   07/22/17 0617  ceFAZolin (ANCEF) 2-4 GM/100ML-% IVPB  Status:  Discontinued    Comments:  Armistead, Lacey   : cabinet override      07/22/17 0617 07/22/17 0652   07/22/17 0556  ceFAZolin (ANCEF) IVPB 2g/100 mL premix     2 g 200 mL/hr over 30 Minutes Intravenous On call to O.R. 07/22/17 3382 07/22/17 0755    .  He was given sequential compression devices, early ambulation, and ASA for DVT prophylaxis.  He benefited maximally from the hospital stay and there were no complications.    Recent vital signs:  Vitals:   07/23/17 0504 07/23/17 1014  BP: (!) 92/55 (!) 92/55  Pulse: 65 73  Resp: 14 14  Temp: 98.1 F (36.7 C) 97.8 F (36.6 C)  SpO2: 100% 100%    Recent laboratory studies:  Lab Results  Component Value Date   HGB 10.3 (L) 07/23/2017   HGB 13.7 07/16/2017   HGB 11.9 (L) 06/04/2017   Lab Results  Component Value Date   WBC 8.1 07/23/2017   PLT 215 07/23/2017   No results found for: INR Lab Results   Component Value Date   NA 139 07/23/2017   K 4.2 07/23/2017   CL 108 07/23/2017   CO2 24 07/23/2017   BUN 8 07/23/2017   CREATININE 0.83 07/23/2017   GLUCOSE 137 (H) 07/23/2017    Discharge Medications:   Allergies as of 07/23/2017   No Known Allergies     Medication List    TAKE these medications   aspirin 81 MG chewable tablet Chew 1 tablet (81 mg total) by mouth 2 (two) times daily.   docusate sodium 100 MG capsule Commonly known as:  COLACE Take 1 capsule (100 mg total) by mouth 2 (two) times daily.   HYDROcodone-acetaminophen 5-325 MG tablet Commonly known as:  NORCO/VICODIN Take 1-2 tablets by mouth every 6 (six) hours as needed for moderate pain. What changed:  when to take this  reasons to take this   meloxicam 15 MG tablet Commonly known as:  MOBIC Take 15 mg by mouth at bedtime.   ondansetron 4 MG tablet Commonly known as:  ZOFRAN Take 1 tablet (4 mg total) by mouth every 6 (six) hours as needed for nausea.   senna 8.6 MG Tabs tablet Commonly known as:  SENOKOT Take 2 tablets (17.2 mg total) by mouth at bedtime.       Diagnostic Studies: Dg Pelvis Portable  Result Date: 07/22/2017 CLINICAL DATA:  Postop left total  hip replaced EXAM: PORTABLE PELVIS 1-2 VIEWS COMPARISON:  Pelvis film 06/03/2017 FINDINGS: The acetabular and femoral components of the left total hip replacement are in good position with no complicating features. Right total hip replacement components are unchanged. The pelvic rami are intact. The SI joints are corticated. IMPRESSION: New left total hip replacement without complicating features. Electronically Signed   By: Ivar Drape M.D.   On: 07/22/2017 10:39   Dg C-arm 1-60 Min-no Report  Result Date: 07/22/2017 Fluoroscopy was utilized by the requesting physician.  No radiographic interpretation.   Dg Hip Operative Unilat W Or W/o Pelvis Left  Result Date: 07/22/2017 CLINICAL DATA:  Left hip replacement. EXAM: OPERATIVE LEFT HIP  (WITH PELVIS IF PERFORMED) 3 VIEWS TECHNIQUE: Fluoroscopic spot image(s) were submitted for interpretation post-operatively. COMPARISON:  06/03/2017. FINDINGS: Total left hip replacement with anatomic alignment. Hardware intact. Prior right hip replacement . No acute abnormality. IMPRESSION: Total left hip replacement anatomic alignment. Electronically Signed   By: Marcello Moores  Register   On: 07/22/2017 11:26    Disposition: 01-Home or Self Care  Discharge Instructions    Call MD / Call 911    Complete by:  As directed    If you experience chest pain or shortness of breath, CALL 911 and be transported to the hospital emergency room.  If you develope a fever above 101 F, pus (white drainage) or increased drainage or redness at the wound, or calf pain, call your surgeon's office.   Constipation Prevention    Complete by:  As directed    Drink plenty of fluids.  Prune juice may be helpful.  You may use a stool softener, such as Colace (over the counter) 100 mg twice a day.  Use MiraLax (over the counter) for constipation as needed.   Diet - low sodium heart healthy    Complete by:  As directed    Driving restrictions    Complete by:  As directed    No driving for 2 weeks   Increase activity slowly as tolerated    Complete by:  As directed    Lifting restrictions    Complete by:  As directed    No lifting for 6 weeks   TED hose    Complete by:  As directed    Use stockings (TED hose) for 2 weeks on both leg(s).  You may remove them at night for sleeping.      Follow-up Information    Clarabelle Oscarson, Aaron Edelman, MD. Schedule an appointment as soon as possible for a visit in 3 weeks.   Specialty:  Orthopedic Surgery Why:  For wound re-check Contact information: [address] [address] [phone]            Signed: Elie Goody 07/24/2017, 8:05 PM

## 2017-07-28 ENCOUNTER — Encounter (HOSPITAL_COMMUNITY): Payer: Self-pay | Admitting: Orthopedic Surgery

## 2017-12-23 ENCOUNTER — Other Ambulatory Visit: Payer: Self-pay | Admitting: Internal Medicine

## 2017-12-23 DIAGNOSIS — B181 Chronic viral hepatitis B without delta-agent: Secondary | ICD-10-CM

## 2017-12-31 ENCOUNTER — Ambulatory Visit (HOSPITAL_COMMUNITY): Payer: 59

## 2018-01-13 ENCOUNTER — Encounter: Payer: Self-pay | Admitting: Internal Medicine

## 2018-02-22 NOTE — Addendum Note (Signed)
Addended by: Janyce Llanos F on: 02/22/2018 04:06 PM   Modules accepted: Orders

## 2018-03-01 ENCOUNTER — Other Ambulatory Visit: Payer: 59

## 2018-03-01 DIAGNOSIS — B169 Acute hepatitis B without delta-agent and without hepatic coma: Secondary | ICD-10-CM

## 2018-03-02 LAB — COMPREHENSIVE METABOLIC PANEL
AG RATIO: 1.8 (calc) (ref 1.0–2.5)
ALT: 15 U/L (ref 9–46)
AST: 18 U/L (ref 10–35)
Albumin: 4.4 g/dL (ref 3.6–5.1)
Alkaline phosphatase (APISO): 61 U/L (ref 40–115)
BUN: 17 mg/dL (ref 7–25)
CALCIUM: 9.5 mg/dL (ref 8.6–10.3)
CO2: 27 mmol/L (ref 20–32)
Chloride: 106 mmol/L (ref 98–110)
Creat: 0.95 mg/dL (ref 0.70–1.33)
Globulin: 2.5 g/dL (calc) (ref 1.9–3.7)
Glucose, Bld: 107 mg/dL — ABNORMAL HIGH (ref 65–99)
Potassium: 4.6 mmol/L (ref 3.5–5.3)
Sodium: 139 mmol/L (ref 135–146)
TOTAL PROTEIN: 6.9 g/dL (ref 6.1–8.1)
Total Bilirubin: 0.5 mg/dL (ref 0.2–1.2)

## 2018-03-02 LAB — HEPATITIS B E ANTIBODY: HEP B E AB: REACTIVE — AB

## 2018-03-02 LAB — HEPATITIS B E ANTIGEN: Hep B E Ag: NONREACTIVE

## 2018-03-03 LAB — HEPATITIS B DNA, ULTRAQUANTITATIVE, PCR
HEPATITIS B DNA (CALC): 2.51 {Log_IU}/mL — AB
Hepatitis B DNA: 323 IU/mL — ABNORMAL HIGH

## 2018-03-15 ENCOUNTER — Ambulatory Visit (INDEPENDENT_AMBULATORY_CARE_PROVIDER_SITE_OTHER): Payer: 59 | Admitting: Internal Medicine

## 2018-03-15 ENCOUNTER — Encounter: Payer: Self-pay | Admitting: Internal Medicine

## 2018-03-15 DIAGNOSIS — B169 Acute hepatitis B without delta-agent and without hepatic coma: Secondary | ICD-10-CM | POA: Diagnosis not present

## 2018-03-15 NOTE — Progress Notes (Signed)
[facility] for Infectious Disease  Patient Active Problem List   Diagnosis Date Noted  . Hepatitis B 07/30/2015    Priority: High  . Osteoarthritis of left hip 07/22/2017  . Alcohol dependence ([place]) 09/09/2015  . Hemorrhoids 07/30/2015    Patient's Medications  New Prescriptions   No medications on file  Previous Medications   ASPIRIN 81 MG CHEWABLE TABLET    Chew 1 tablet (81 mg total) by mouth 2 (two) times daily.   DOCUSATE SODIUM (COLACE) 100 MG CAPSULE    Take 1 capsule (100 mg total) by mouth 2 (two) times daily.   HYDROCODONE-ACETAMINOPHEN (NORCO/VICODIN) 5-325 MG TABLET    Take 1-2 tablets by mouth every 6 (six) hours as needed for moderate pain.   MELOXICAM (MOBIC) 15 MG TABLET    Take 15 mg by mouth at bedtime.    ONDANSETRON (ZOFRAN) 4 MG TABLET    Take 1 tablet (4 mg total) by mouth every 6 (six) hours as needed for nausea.   SENNA (SENOKOT) 8.6 MG TABS TABLET    Take 2 tablets (17.2 mg total) by mouth at bedtime.  Modified Medications   No medications on file  Discontinued Medications   No medications on file    Subjective: James Arroyo is in for his routine follow-up visit.  He has had both hips replaced since his last visit.  His recovery was uneventful.  His bilateral hip pain improved promptly after both surgeries.  He has been completely abstinent from alcohol for the past 21 months.  Review of Systems: Review of Systems  Constitutional: Negative for chills, fever, malaise/fatigue and weight loss.  Gastrointestinal: Negative for abdominal pain, diarrhea, nausea and vomiting.  Psychiatric/Behavioral: Negative for substance abuse.    Past Medical History:  Diagnosis Date  . Hemorrhoids     Social History   Tobacco Use  . Smoking status: Former Smoker    Last attempt to quit: 1994    Years since quitting: 25.4  . Smokeless tobacco: Never Used  Substance Use Topics  . Alcohol use: No    Comment: Recovering alcoholic  . Drug use: No     Family History  Problem Relation Age of Onset  . Cancer Mother   . Kidney disease Father   . Hypertension Father   . Colon cancer Neg Hx   . Pancreatic cancer Neg Hx   . Stomach cancer Neg Hx     No Known Allergies  Objective: Vitals:   03/15/18 0846  BP: 121/76  Pulse: 87  Temp: 97.9 F (36.6 C)  TempSrc: Oral  Weight: 164 lb (74.4 kg)   Body mass index is 26.47 kg/m.  Physical Exam  Constitutional:  He is in good spirits.  Abdominal: Soft. He exhibits no distension and no mass. There is no tenderness.    Lab Results CMP     Component Value Date/Time   NA 139 03/01/2018 0849   K 4.6 03/01/2018 0849   CL 106 03/01/2018 0849   CO2 27 03/01/2018 0849   GLUCOSE 107 (H) 03/01/2018 0849   BUN 17 03/01/2018 0849   CREATININE 0.95 03/01/2018 0849   CALCIUM 9.5 03/01/2018 0849   PROT 6.9 03/01/2018 0849   ALBUMIN 4.3 02/25/2017 1023   AST 18 03/01/2018 0849   ALT 15 03/01/2018 0849   ALKPHOS 54 02/25/2017 1023   BILITOT 0.5 03/01/2018 0849   GFRNONAA >60 07/23/2017 0540   GFRAA >60 07/23/2017 0540   Hepatitis  B E antigen 03/01/2018: Negative Hepatitis B E antibody 03/01/2018: Positive Hepatitis B DNA viral load 03/01/2017: 323  His recent abdominal ultrasound was normal without any evidence of cirrhosis focal hepatic lesions.   Problem List Items Addressed This Visit      High   Hepatitis B   Relevant Orders   Hepatitis B DNA, ultraquantitative, PCR   Hepatitis B e antibody   Hepatitis B e antigen   Comprehensive metabolic panel       James Bickers, MD [facility] for Infectious [place] [facility] [phone] pager   [phone] cell 03/15/2018, 8:57 AM

## 2018-03-15 NOTE — Assessment & Plan Note (Signed)
His chronic hepatitis B is stable with normal liver ultrasound and liver enzymes.  He continues to have very low level viral activation.  There is no indication to start antiviral therapy at this time.  He will follow-up after repeat lab work in 1 year.

## 2018-07-26 IMAGING — DX DG PORTABLE PELVIS
1 series · 1 of 1 positions shown · non-contrast
Comparison: 06/03/2017 .

CLINICAL DATA: Total right hip replacement.

EXAM:
PORTABLE PELVIS 1-2 VIEWS

[pelvis ap]
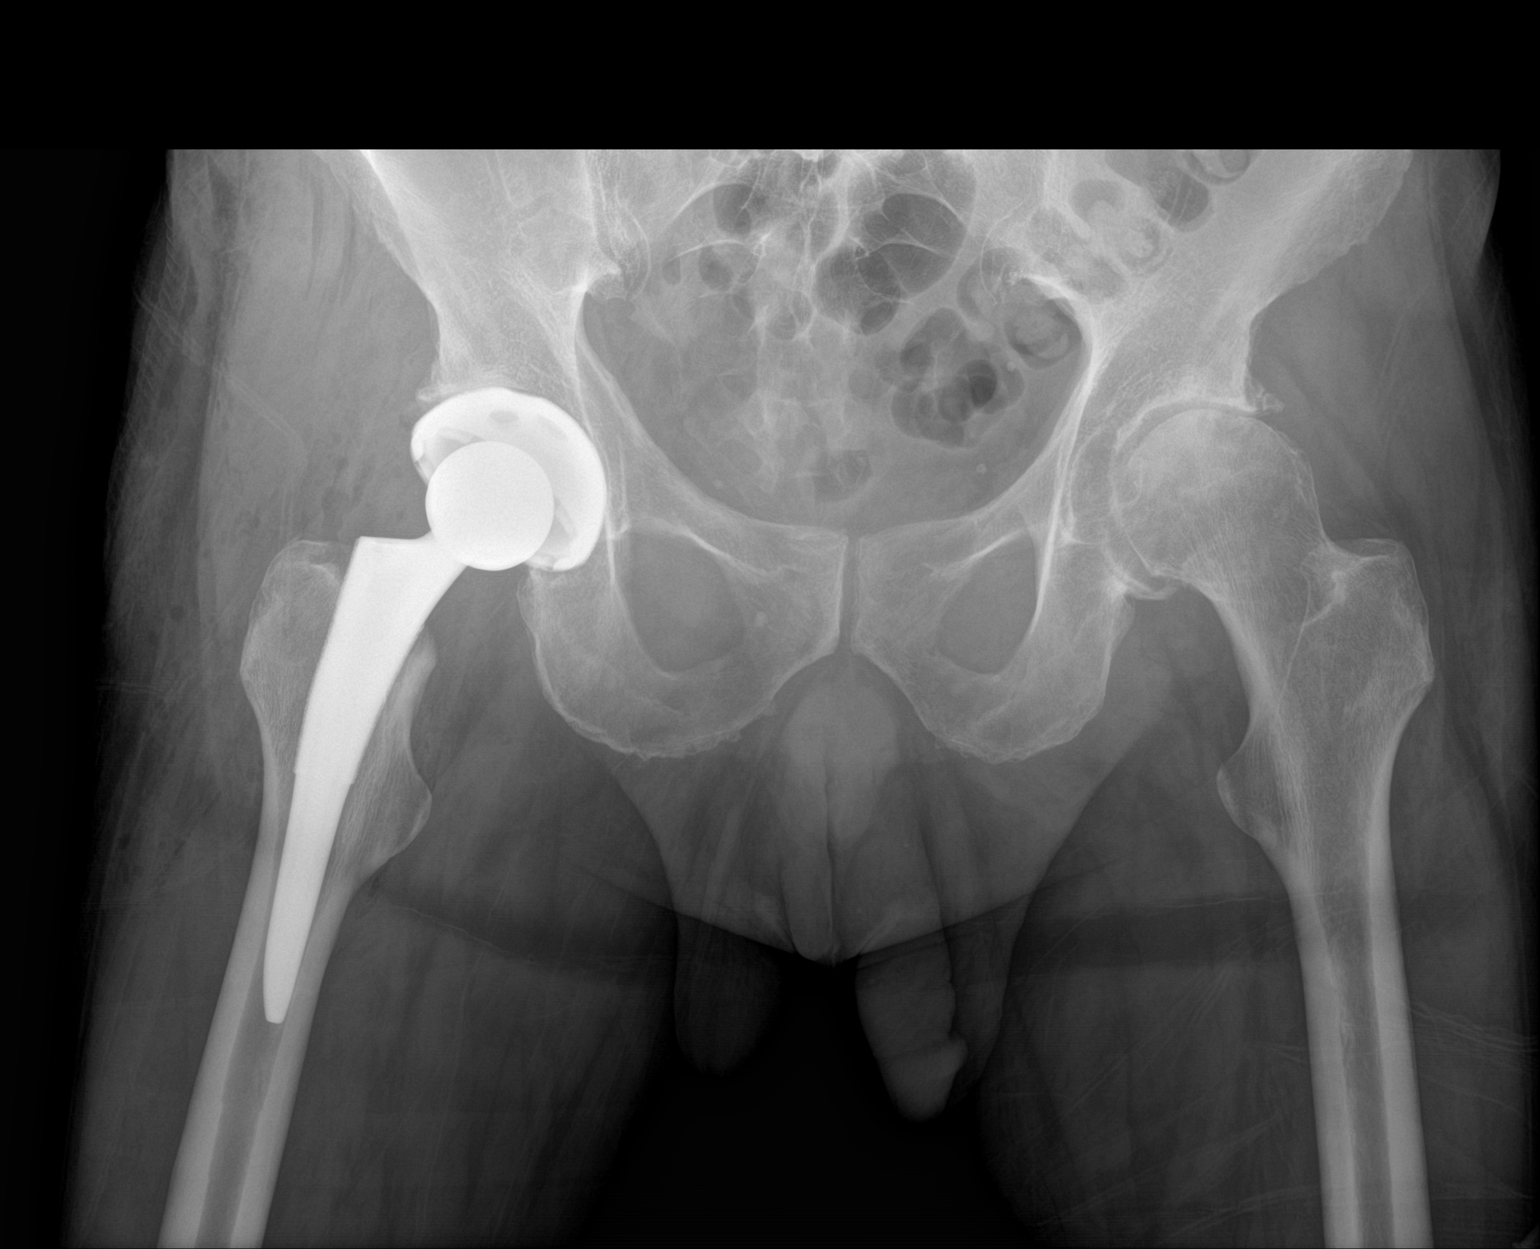

[1 of 1 positions shown; findings below may reference images not displayed]

FINDINGS: Total right hip replacement with postsurgical changes noted .
Anatomic alignment. Hardware intact . No acute bony abnormality
identified.
IMPRESSION: Total right hip replacement.  Anatomic alignment.  Hardware intact.

## 2018-09-13 IMAGING — DX DG PORTABLE PELVIS
1 series · 1 of 1 positions shown · non-contrast
Comparison: Pelvis film 06/03/2017

CLINICAL DATA: Postop left total hip replaced

EXAM:
PORTABLE PELVIS 1-2 VIEWS

[pelvis ap]
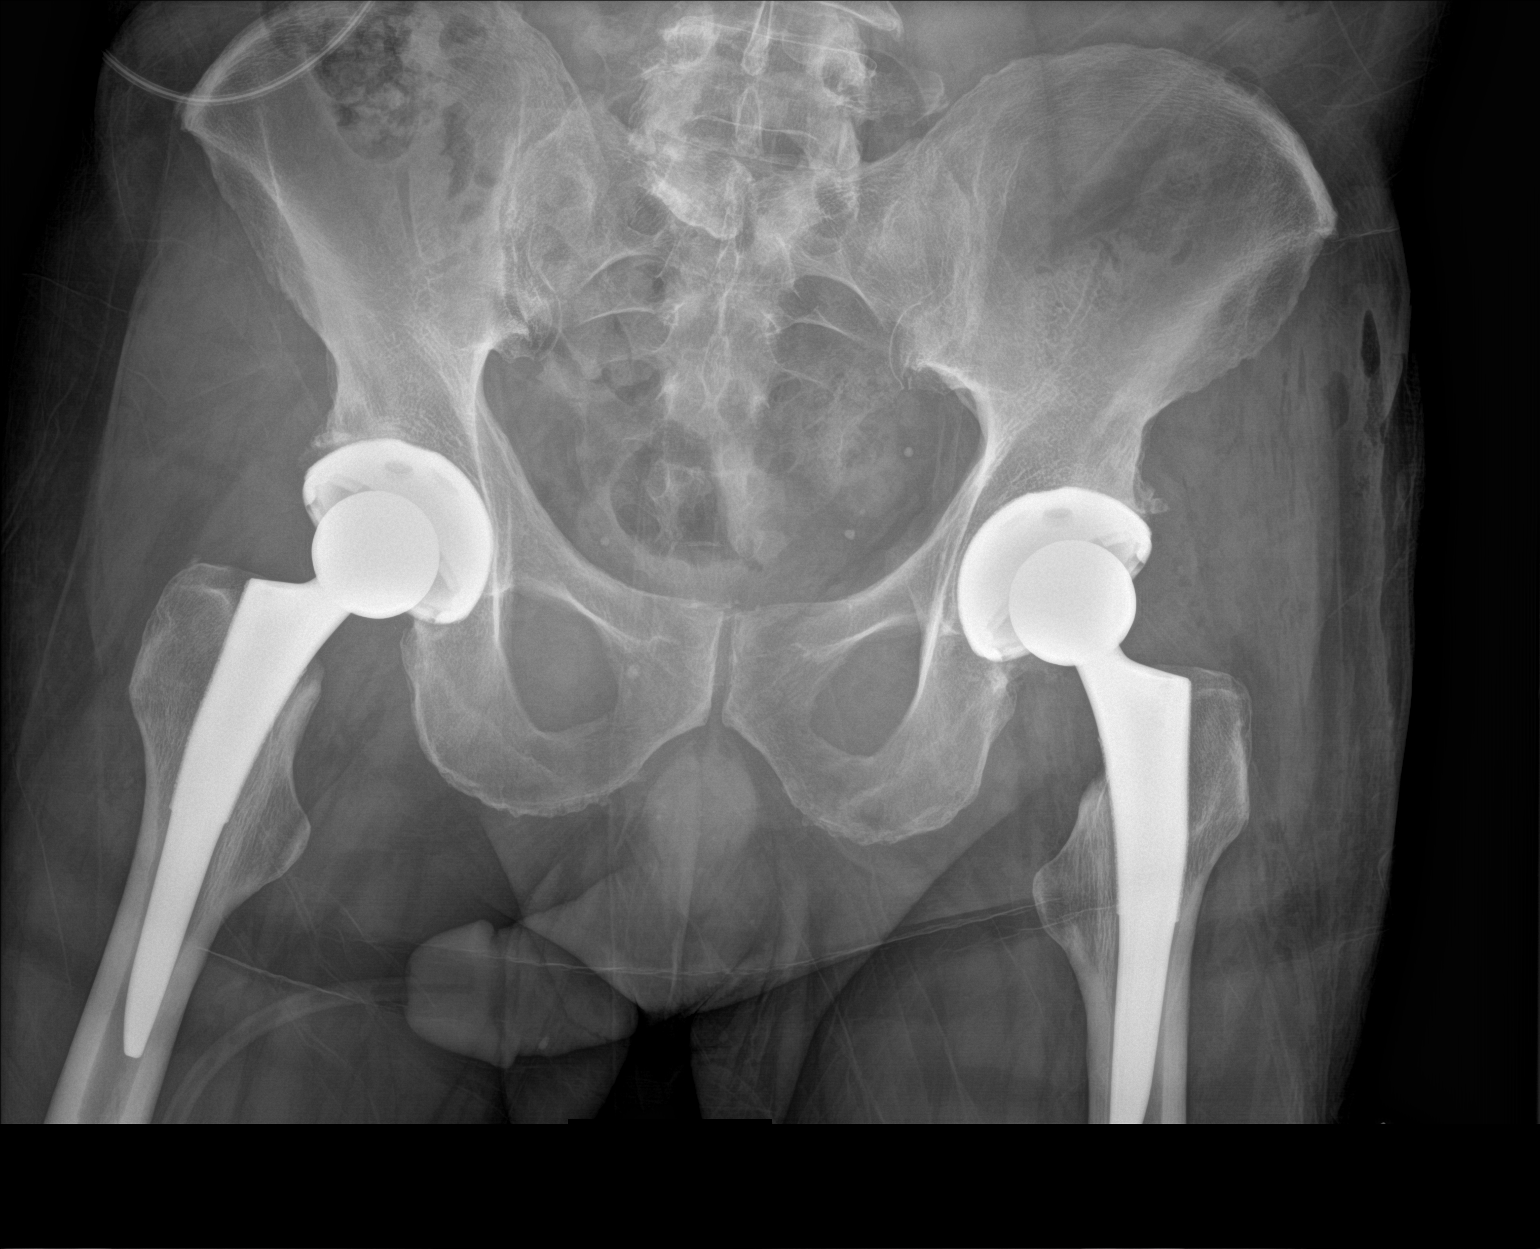

[1 of 1 positions shown; findings below may reference images not displayed]

FINDINGS: The acetabular and femoral components of the left total hip
replacement are in good position with no complicating features.
Right total hip replacement components are unchanged. The pelvic
rami are intact. The SI joints are corticated.
IMPRESSION: New left total hip replacement without complicating features.

## 2018-09-13 IMAGING — RF DG HIP (WITH PELVIS) OPERATIVE*L*
1 series · 3 of 3 positions shown · non-contrast
Comparison: 06/03/2017.

CLINICAL DATA: Left hip replacement.

EXAM:
OPERATIVE LEFT HIP (WITH PELVIS IF PERFORMED) 3 VIEWS
TECHNIQUE: Fluoroscopic spot image(s) were submitted for interpretation
post-operatively.

[Series 1: run · 3 of 3 slices shown]
[im 1/3]
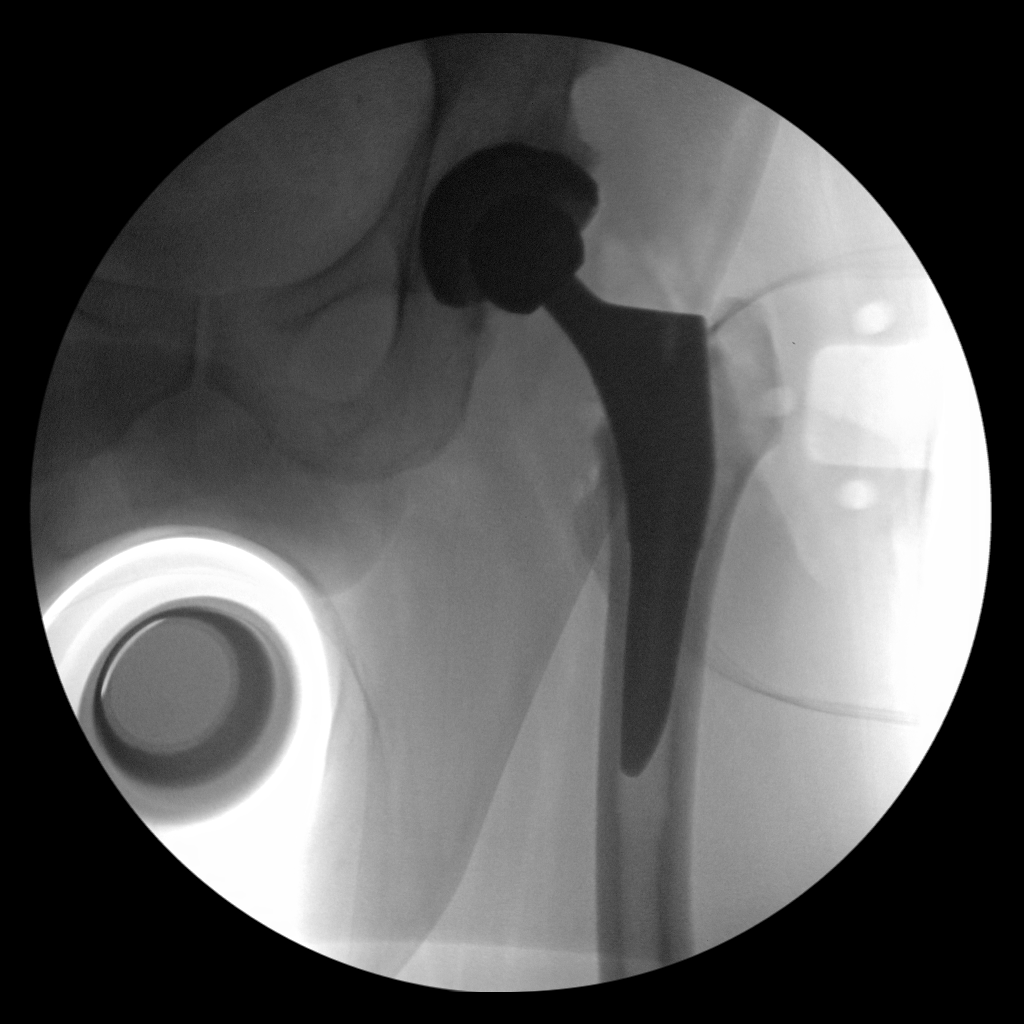
[im 2/3]
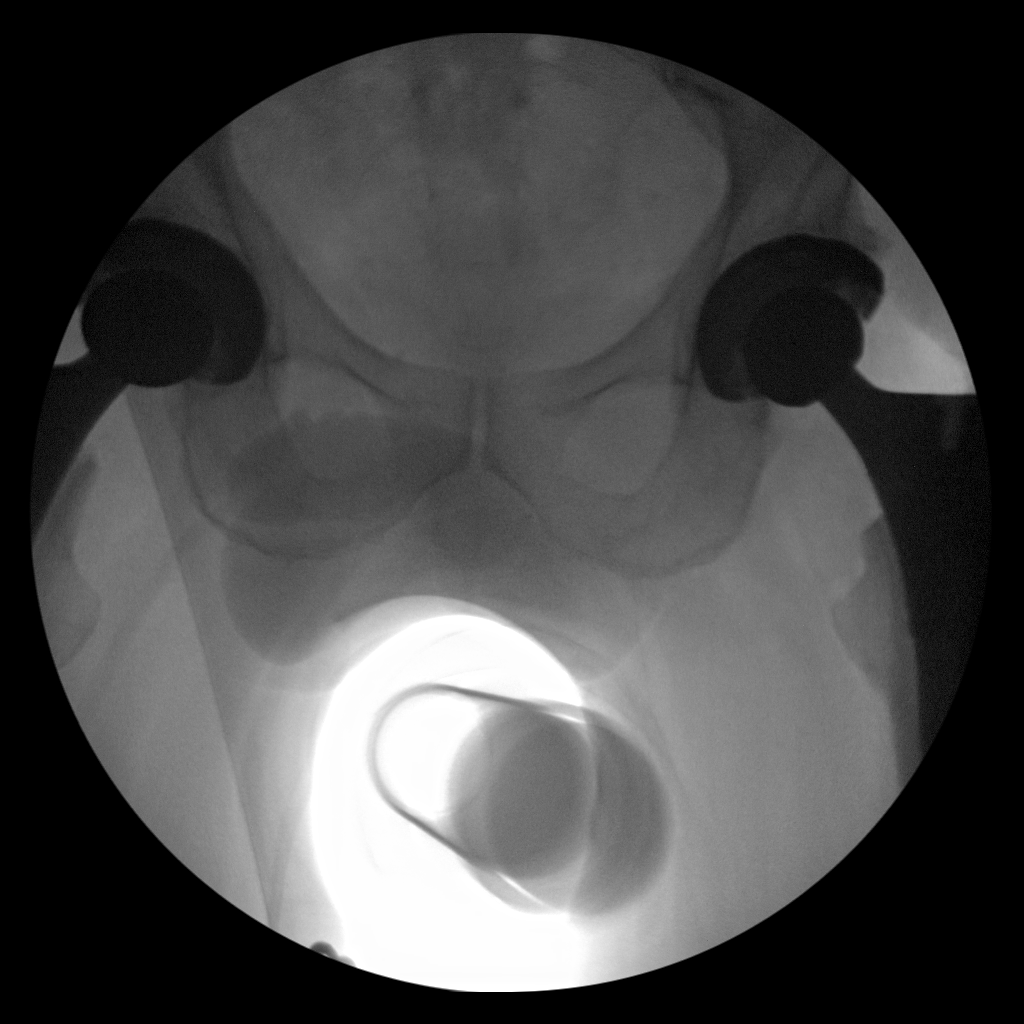
[im 3/3]
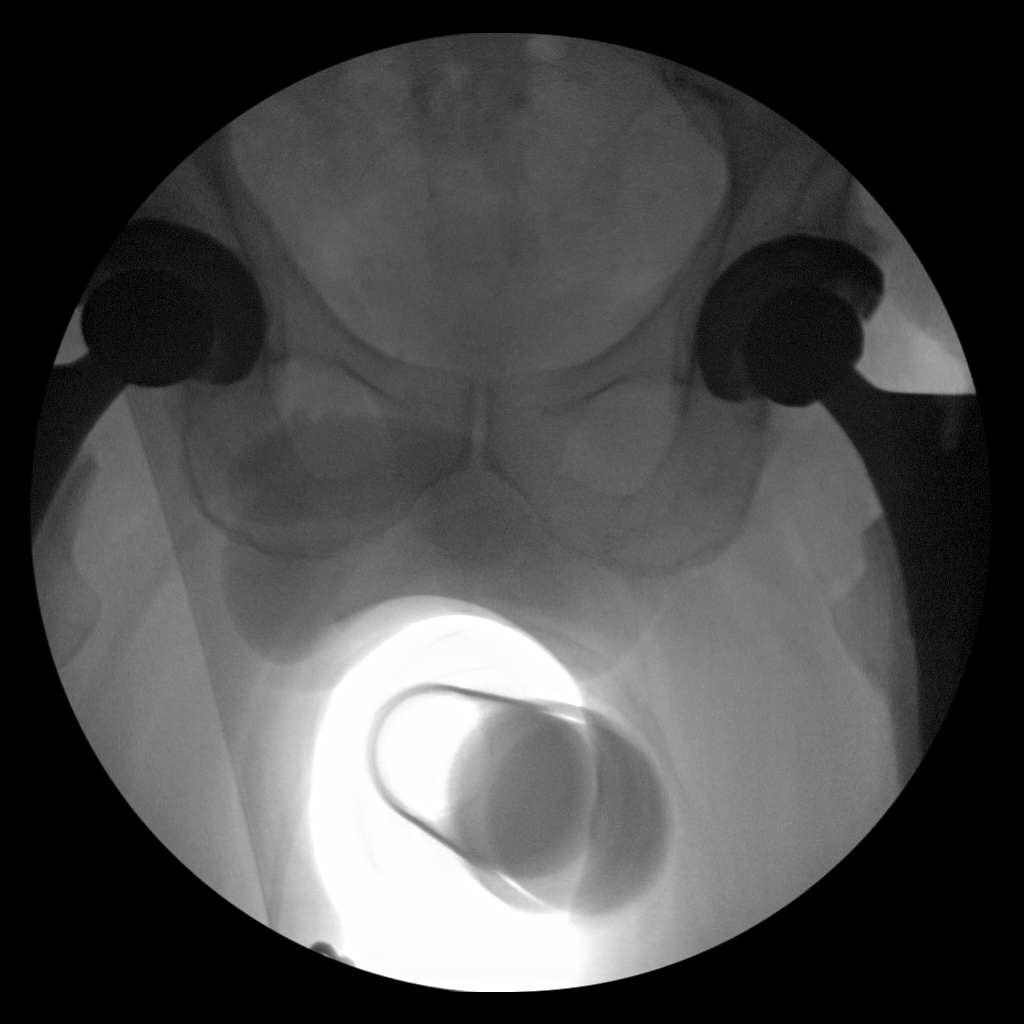

[3 of 3 positions shown; findings below may reference images not displayed]

FINDINGS: Total left hip replacement with anatomic alignment. Hardware intact.
Prior right hip replacement . No acute abnormality.
IMPRESSION: Total left hip replacement anatomic alignment.

## 2019-03-09 ENCOUNTER — Encounter: Payer: Self-pay | Admitting: Internal Medicine

## 2019-03-16 ENCOUNTER — Other Ambulatory Visit: Payer: BC Managed Care – PPO

## 2019-03-16 ENCOUNTER — Other Ambulatory Visit: Payer: Self-pay

## 2019-03-16 DIAGNOSIS — B169 Acute hepatitis B without delta-agent and without hepatic coma: Secondary | ICD-10-CM

## 2019-03-22 LAB — COMPREHENSIVE METABOLIC PANEL
AG Ratio: 1.9 (calc) (ref 1.0–2.5)
ALT: 20 U/L (ref 9–46)
AST: 17 U/L (ref 10–35)
Albumin: 4.6 g/dL (ref 3.6–5.1)
Alkaline phosphatase (APISO): 61 U/L (ref 35–144)
BUN: 13 mg/dL (ref 7–25)
CO2: 27 mmol/L (ref 20–32)
Calcium: 9.7 mg/dL (ref 8.6–10.3)
Chloride: 103 mmol/L (ref 98–110)
Creat: 0.86 mg/dL (ref 0.70–1.33)
Globulin: 2.4 g/dL (calc) (ref 1.9–3.7)
Glucose, Bld: 100 mg/dL — ABNORMAL HIGH (ref 65–99)
Potassium: 4.7 mmol/L (ref 3.5–5.3)
Sodium: 138 mmol/L (ref 135–146)
Total Bilirubin: 0.4 mg/dL (ref 0.2–1.2)
Total Protein: 7 g/dL (ref 6.1–8.1)

## 2019-03-22 LAB — HEPATITIS B DNA, ULTRAQUANTITATIVE, PCR
Hepatitis B DNA (Calc): 2.03 Log IU/mL — ABNORMAL HIGH
Hepatitis B DNA: 108 IU/mL — ABNORMAL HIGH

## 2019-03-22 LAB — HEPATITIS B E ANTIBODY: Hep B E Ab: REACTIVE — AB

## 2019-03-22 LAB — HEPATITIS B E ANTIGEN: Hep B E Ag: NONREACTIVE

## 2019-04-05 ENCOUNTER — Telehealth: Payer: Self-pay | Admitting: Internal Medicine

## 2019-04-05 NOTE — Telephone Encounter (Signed)
COVID-19 Pre-Screening Questions:04/05/19 ° ° °Do you currently have a fever (>100 °F), chills or unexplained body aches? NO ° °• Are you currently experiencing new cough, shortness of breath, sore throat, runny nose? NO °•  °Have you recently travelled outside the state of [place] in the last 14 days?NO °•  °Have you been in contact with someone that is currently pending confirmation of Covid19 testing or has been confirmed to have the Covid19 virus?  NO ° °**If the patient answers NO to ALL questions -  advise the patient to please call the clinic before coming to the office should any symptoms develop.  ° °

## 2019-04-06 ENCOUNTER — Ambulatory Visit: Payer: BC Managed Care – PPO | Admitting: Internal Medicine

## 2019-04-06 ENCOUNTER — Other Ambulatory Visit: Payer: Self-pay

## 2019-04-06 ENCOUNTER — Encounter: Payer: Self-pay | Admitting: Internal Medicine

## 2019-04-06 DIAGNOSIS — B162 Acute hepatitis B without delta-agent with hepatic coma: Secondary | ICD-10-CM

## 2019-04-06 NOTE — Assessment & Plan Note (Signed)
His infection remains under very good control and he has no current indications to start treatment.  He will follow-up after lab work in 1 year.  I will consider repeating his ultrasound next year.

## 2019-04-06 NOTE — Progress Notes (Signed)
[facility] for Infectious Disease  Patient Active Problem List   Diagnosis Date Noted  . Hepatitis B 07/30/2015    Priority: High  . Osteoarthritis of left hip 07/22/2017  . Alcohol dependence ([place]) 09/09/2015  . Hemorrhoids 07/30/2015    Patient's Medications  New Prescriptions   No medications on file  Previous Medications   ASPIRIN 81 MG CHEWABLE TABLET    Chew 1 tablet (81 mg total) by mouth 2 (two) times daily.   DOCUSATE SODIUM (COLACE) 100 MG CAPSULE    Take 1 capsule (100 mg total) by mouth 2 (two) times daily.   HYDROCODONE-ACETAMINOPHEN (NORCO/VICODIN) 5-325 MG TABLET    Take 1-2 tablets by mouth every 6 (six) hours as needed for moderate pain.   MELOXICAM (MOBIC) 15 MG TABLET    Take 15 mg by mouth at bedtime.    ONDANSETRON (ZOFRAN) 4 MG TABLET    Take 1 tablet (4 mg total) by mouth every 6 (six) hours as needed for nausea.   SENNA (SENOKOT) 8.6 MG TABS TABLET    Take 2 tablets (17.2 mg total) by mouth at bedtime.  Modified Medications   No medications on file  Discontinued Medications   No medications on file    Subjective: James Arroyo is in for his routine follow-up visit.  He is feeling well.  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  He enjoys his work thank you. He remains sober and free of alcohol.  Review of Systems: Review of Systems  Constitutional: Negative for chills, fever, malaise/fatigue and weight loss.  Gastrointestinal: Negative for abdominal pain, diarrhea, nausea and vomiting.  Psychiatric/Behavioral: Negative for substance abuse.    Past Medical History:  Diagnosis Date  . Hemorrhoids     Social History   Tobacco Use  . Smoking status: Former Smoker    Quit date: 1994    Years since quitting: 26.5  . Smokeless tobacco: Never Used  Substance Use Topics  . Alcohol use: No    Comment: Recovering alcoholic  . Drug use: No    Family History  Problem Relation Age of Onset  . Cancer Mother   .  Kidney disease Father   . Hypertension Father   . Colon cancer Neg Hx   . Pancreatic cancer Neg Hx   . Stomach cancer Neg Hx     No Known Allergies  Objective: Vitals:   04/06/19 0849  BP: 113/75  Pulse: 73  Temp: 98 F (36.7 C)  Weight: 168 lb (76.2 kg)  Height: 5\' 6"  (1.676 m)   Body mass index is 27.12 kg/m.  Physical Exam Constitutional:      Comments: He is in good spirits.  Abdominal:     General: There is no distension.     Palpations: Abdomen is soft. There is no mass.     Tenderness: There is no abdominal tenderness.     Lab Results CMP     Component Value Date/Time   NA 138 03/16/2019 0859   K 4.7 03/16/2019 0859   CL 103 03/16/2019 0859   CO2 27 03/16/2019 0859   GLUCOSE 100 (H) 03/16/2019 0859   BUN 13 03/16/2019 0859   CREATININE 0.86 03/16/2019 0859   CALCIUM 9.7 03/16/2019 0859   PROT 7.0 03/16/2019 0859   ALBUMIN 4.3 02/25/2017 1023   AST 17 03/16/2019 0859   ALT 20 03/16/2019 0859   ALKPHOS 54 02/25/2017 1023   BILITOT  0.4 03/16/2019 0859   GFRNONAA >60 07/23/2017 0540   GFRAA >60 07/23/2017 0540   Hepatitis B E antigen 03/01/2018: Negative Hepatitis B E antibody 03/01/2018: Positive Hepatitis B DNA viral load 03/01/2017: 108     Problem List Items Addressed This Visit      High   Hepatitis B    His infection remains under very good control and he has no current indications to start treatment.  He will follow-up after lab work in 1 year.  I will consider repeating his ultrasound next year.      Relevant Orders   Hepatitis B DNA, ultraquantitative, PCR   Hepatitis B e antibody   Hepatitis B e antigen   Comprehensive metabolic panel       Michel Bickers, MD [facility] for Infectious [place] [phone] pager   [phone] cell 04/06/2019, 9:04 AM

## 2019-05-24 ENCOUNTER — Telehealth: Payer: Self-pay

## 2019-05-24 NOTE — Telephone Encounter (Signed)
Patient called for advise. States his PCP is wanting to prescribe patient Lamisil and wanted Dr. Hale Bogus opinion if patient should take this medication and if it will  impact his liver enzymes. Routing message to provider and pharmacy for advise.  Eugenia Mcalpine

## 2019-05-25 NOTE — Telephone Encounter (Signed)
The risk of liver damage from Lamisil is low.  I am okay with him taking it.  If he is concerned he can have his PCP check his liver enzymes as an about 1 month.

## 2019-05-25 NOTE — Telephone Encounter (Signed)
Call patient and left voicemail and informed him of Dr. Hale Bogus recommendation.  Eugenia Mcalpine

## 2019-05-28 ENCOUNTER — Emergency Department: Admit: 2019-05-29 | Payer: BLUE CROSS/BLUE SHIELD

## 2019-05-28 DIAGNOSIS — S0101XA Laceration without foreign body of scalp, initial encounter: Secondary | ICD-10-CM

## 2019-05-28 NOTE — ED Notes (Signed)
Wound to left scalp cleansed/irrigated with NS. RRouweyha, PA at bedside.     Bethanne Ginger, RN  05/28/19 [phone]

## 2019-05-28 NOTE — ED Triage Notes (Addendum)
Pt arrived in ED via EMS with c/o headache and laceration to posterior head from an unwitnessed mechanical fall where pt states he tripped over a step and fell onto a wooden door frame. Denies blood thinners, denies LOC. Pt has a dressing in place by EMS PTA. Ice pack to posterior head. Family member at bedside.

## 2019-05-28 NOTE — ED Provider Notes (Signed)
Independent MLP  HPI:  05/28/19, Time: 8:50 PM EDT         Raymond Stephenson is a 58 y.o. male presenting to the ED for fall head injury , beginning prior to arrival  .  The complaint has been persistent, moderate in severity, and worsened by nothing.   patient comes in with complaint of fall.  He states he slipped on a step falling hitting his head on the edge of the door frame.  Denies any loss of consciousness.  He denies any blood thinners.  Patient denies any neck pain.  Denies any pain from upper or lower extremities.        Review of Systems:   Pertinent positives and negatives are stated within HPI, all other systems reviewed and are negative.          --------------------------------------------- PAST HISTORY ---------------------------------------------  Past Medical History:  has a past medical history of Hepatitis B and Recovering alcoholic ([place]).    Past Surgical History:  has a past surgical history that includes joint replacement (Bilateral, 2018) and SAPHENOUS VEIN ABLATION (Left, 2018).    Social History:  reports that he has never smoked. He has never used smokeless tobacco. He reports previous alcohol use.    Family History: family history is not on file.     The patient???s home medications have been reviewed.    Allergies: Patient has no known allergies.    -------------------------------------------------- RESULTS -------------------------------------------------  All laboratory and radiology results have been personally reviewed by myself   LABS:  No results found for this visit on 05/28/19.    RADIOLOGY:  Interpreted by Radiologist.  [place]   Final Result      NO ACUTE INTRACRANIAL PROCESS      Left maxillary sinusitis with air-fluid level and mucous periosteal   thickening      Left parietal extracranial soft tissue swelling         CT CERVICAL SPINE WO CONTRAST   Final Result      NO ACUTE FRACTURE OR SIGNIFICANT SUBLUXATION IS IDENTIFIED      Moderate is severe disc disease with  exuberant bridging osteophytes   from C4 to T1. The disc disease most severe at C5-C6.Marland Kitchen              ------------------------- NURSING NOTES AND VITALS REVIEWED ---------------------------   The nursing notes within the ED encounter and vital signs as below have been reviewed.   BP 127/79    Pulse 66    Temp 98.6 ??F (37 ??C) (Oral)    Resp 16    Ht 5' 5.5" (1.664 m)    Wt 167 lb (75.8 kg)    SpO2 99%    BMI 27.37 kg/m??   Oxygen Saturation Interpretation: Normal      ---------------------------------------------------PHYSICAL EXAM--------------------------------------      Constitutional/General: Alert and oriented x3, well appearing, non toxic in NAD  Head: Normocephalic 7 cm scalp laceration of the left parietal scalp.  Eyes: PERRL, EOMI  Mouth: Oropharynx clear, handling secretions, no trismus  Neck: Supple, full ROM, no vertebral point tenderness  Pulmonary: Lungs clear to auscultation bilaterally, no wheezes, rales, or rhonchi. Not in respiratory distress  Cardiovascular:  Regular rate and rhythm, no murmurs, gallops, or rubs. 2+ distal pulses  Abdomen: Soft, non tender, non distended,   Extremities: Moves all extremities x 4. Warm and well perfused  Skin: warm and dry without rash  Neurologic: GCS 15,  Psych: Normal Affect      ------------------------------  ED COURSE/MEDICAL DECISION MAKING----------------------  Medications   lidocaine 2 % injection 5 mL (5 mLs Intradermal Given by Other 05/28/19 2331)   acetaminophen (TYLENOL) tablet 1,000 mg (1,000 mg Oral Given 05/29/19 0024)         ED COURSE:  PROCEDURE NOTE  05/29/19       Time:230    LACERATION REPAIR  Risks, benefits and alternatives (for applicable procedures below) described.   Performed By: Griffin Basilenee M Doris Gruhn, PA.    Laceration #: 1.  Location: Left parietal scalp  Length: 7 cm.  The wound area was irrigated with sterile water, cleansed with povidone iodine, cleansend with shur-clens and draped in a sterile fashion.  Local Anesthesia:  Lidocaine 1% without  epinephrine.  The wound was explored with the following results:  No foreign bodies found.  Debridement: none   Undermining: partial thickness.  Wound Margins Revised: yes.  Flaps Aligned: yes.  The wound was closed with 3-0 Ethilon using interrupted sutures.  Dressing:  bacitracin and a sterile dressing was placed.    Total number suture:  11.    There were no additional lacerations requiring repair.       Medical Decision Making:    Patient came in with complaint of mechanical fall with head injury scalp laceration.  CT head no intracranial bleed.  There was finding of sinusitis patient had no complaint of any congestion fever chills.  CT cervical degenerative disc disease.  Scalp laceration was closed with 11 3 of sutures suture removal in 7 to 10 days.  Was placed on bacitracin ointment he is to follow-up with his primary care on Monday.    Counseling:   The emergency provider has spoken with the patient and discussed today???s results, in addition to providing specific details for the plan of care and counseling regarding the diagnosis and prognosis.  Questions are answered at this time and they are agreeable with the plan.      --------------------------------- IMPRESSION AND DISPOSITION ---------------------------------    IMPRESSION  1. Injury of head, initial encounter    2. Neck sprain, initial encounter    3. Laceration of scalp without foreign body, initial encounter        DISPOSITION  Disposition: Discharge to home  Patient condition is good      NOTE: This report was transcribed using voice recognition software. Every effort was made to ensure accuracy; however, inadvertent computerized transcription errors may be present     Griffin BasilRenee M Talibah Colasurdo, [place]  05/29/19 [phone]

## 2019-05-28 NOTE — ED Notes (Signed)
Bed: 18  Expected date:   Expected time:   Means of arrival:   Comments:  EMS     Maryjean Morn, RN  05/28/19 2038

## 2019-05-28 NOTE — ED Notes (Signed)
Wound measures approx 8 cm x 1 cm in C shape     Bethanne Ginger, RN  05/28/19 [phone]

## 2019-05-28 NOTE — Discharge Instructions (Signed)
Tylenol as needed for headache.  Any change in mental status confusion persistent vomiting return to the ER for CT of the head

## 2019-05-29 ENCOUNTER — Inpatient Hospital Stay
Admit: 2019-05-29 | Discharge: 2019-05-29 | Disposition: A | Discharge status: Home / Self Care | Payer: BLUE CROSS/BLUE SHIELD

## 2019-05-29 MED ORDER — BACITRACIN-POLYMYXIN B 500-10000 UNIT/GM EX OINT
500-10000 UNIT/GM | CUTANEOUS | 0 refills | Status: AC
Start: 2019-05-29 — End: 2019-06-04

## 2019-05-29 MED ORDER — LIDOCAINE HCL 2 % IJ SOLN
2 % | Freq: Once | INTRAMUSCULAR | Status: AC
Start: 2019-05-29 — End: 2019-05-28
  Administered 2019-05-29: 04:00:00 5 mL via INTRADERMAL

## 2019-05-29 MED ORDER — ACETAMINOPHEN 500 MG PO TABS
500 MG | Freq: Once | ORAL | Status: AC
Start: 2019-05-29 — End: 2019-05-29
  Administered 2019-05-29: 04:00:00 1000 mg via ORAL

## 2019-05-29 MED FILL — ACETAMINOPHEN EXTRA STRENGTH 500 MG PO TABS: 500 mg | ORAL | Qty: 2

## 2019-05-29 MED FILL — XYLOCAINE 2 % IJ SOLN: 2 % | INTRAMUSCULAR | Qty: 20

## 2019-05-29 NOTE — ED Notes (Signed)
Wound covered with non adherent dressing, covered with ABD and wrapped with kling.      Bethanne Ginger, RN  05/29/19 [phone]

## 2019-05-29 NOTE — ED Notes (Signed)
Discharge instructions given and reviewed with patient and family. RX given. Instructed to follow up with Dr Gae Dry. Questions and concerns addressed. Pt departed ED ambulatory in no apparent distress with family; declined use of w/c. Personal belongings taken.     Bethanne Ginger, RN  05/29/19 [phone]

## 2020-03-20 ENCOUNTER — Other Ambulatory Visit: Payer: BC Managed Care – PPO

## 2020-03-20 ENCOUNTER — Other Ambulatory Visit: Payer: Self-pay

## 2020-03-20 DIAGNOSIS — B162 Acute hepatitis B without delta-agent with hepatic coma: Secondary | ICD-10-CM

## 2020-03-23 LAB — COMPREHENSIVE METABOLIC PANEL
AG Ratio: 2 (calc) (ref 1.0–2.5)
ALT: 18 U/L (ref 9–46)
AST: 16 U/L (ref 10–35)
Albumin: 4.5 g/dL (ref 3.6–5.1)
Alkaline phosphatase (APISO): 61 U/L (ref 35–144)
BUN: 19 mg/dL (ref 7–25)
CO2: 28 mmol/L (ref 20–32)
Calcium: 9.6 mg/dL (ref 8.6–10.3)
Chloride: 106 mmol/L (ref 98–110)
Creat: 0.9 mg/dL (ref 0.70–1.33)
Globulin: 2.3 g/dL (calc) (ref 1.9–3.7)
Glucose, Bld: 89 mg/dL (ref 65–99)
Potassium: 4.3 mmol/L (ref 3.5–5.3)
Sodium: 141 mmol/L (ref 135–146)
Total Bilirubin: 0.7 mg/dL (ref 0.2–1.2)
Total Protein: 6.8 g/dL (ref 6.1–8.1)

## 2020-03-23 LAB — HEPATITIS B E ANTIGEN: Hep B E Ag: NONREACTIVE

## 2020-03-23 LAB — HEPATITIS B DNA, ULTRAQUANTITATIVE, PCR
Hepatitis B DNA (Calc): 2.88 Log IU/mL — ABNORMAL HIGH
Hepatitis B DNA: 763 IU/mL — ABNORMAL HIGH

## 2020-03-23 LAB — HEPATITIS B E ANTIBODY: Hep B E Ab: REACTIVE — AB

## 2020-04-04 ENCOUNTER — Other Ambulatory Visit: Payer: Self-pay

## 2020-04-04 ENCOUNTER — Encounter: Payer: Self-pay | Admitting: Internal Medicine

## 2020-04-04 ENCOUNTER — Ambulatory Visit: Payer: BC Managed Care – PPO | Admitting: Internal Medicine

## 2020-04-04 DIAGNOSIS — B162 Acute hepatitis B without delta-agent with hepatic coma: Secondary | ICD-10-CM

## 2020-04-04 NOTE — Progress Notes (Signed)
[facility] for Infectious Disease  Patient Active Problem List   Diagnosis Date Noted  . Hepatitis B 07/30/2015    Priority: High  . Osteoarthritis of left hip 07/22/2017  . Alcohol dependence ([place]) 09/09/2015  . Hemorrhoids 07/30/2015    Patient's Medications  New Prescriptions   No medications on file  Previous Medications   ASPIRIN 81 MG CHEWABLE TABLET    Chew 1 tablet (81 mg total) by mouth 2 (two) times daily.   DOCUSATE SODIUM (COLACE) 100 MG CAPSULE    Take 1 capsule (100 mg total) by mouth 2 (two) times daily.   HYDROCODONE-ACETAMINOPHEN (NORCO/VICODIN) 5-325 MG TABLET    Take 1-2 tablets by mouth every 6 (six) hours as needed for moderate pain.   MELOXICAM (MOBIC) 15 MG TABLET    Take 15 mg by mouth at bedtime.    NAPROXEN (NAPROSYN) 500 MG TABLET    Take by mouth.   ONDANSETRON (ZOFRAN) 4 MG TABLET    Take 1 tablet (4 mg total) by mouth every 6 (six) hours as needed for nausea.   SENNA (SENOKOT) 8.6 MG TABS TABLET    Take 2 tablets (17.2 mg total) by mouth at bedtime.   TADALAFIL (CIALIS) 10 MG TABLET    Take by mouth.  Modified Medications   No medications on file  Discontinued Medications   No medications on file    Subjective: James Arroyo is in for his routine follow-up visit.  He is feeling well.  He and his close family all had Covid infection in March.  His was very mild and he has recovered completely.  He wants my advice on which Covid vaccine to take.  Review of Systems: Review of Systems  Constitutional: Negative for chills, fever, malaise/fatigue and weight loss.  Gastrointestinal: Negative for abdominal pain, diarrhea, nausea and vomiting.  Psychiatric/Behavioral: Negative for substance abuse.    Past Medical History:  Diagnosis Date  . Hemorrhoids     Social History   Tobacco Use  . Smoking status: Former Smoker    Quit date: 1994    Years since quitting: 27.5  . Smokeless tobacco: Never Used  Vaping Use  . Vaping Use: Never  used  Substance Use Topics  . Alcohol use: No    Comment: Recovering alcoholic  . Drug use: No    Family History  Problem Relation Age of Onset  . Cancer Mother   . Kidney disease Father   . Hypertension Father   . Colon cancer Neg Hx   . Pancreatic cancer Neg Hx   . Stomach cancer Neg Hx     No Known Allergies  Objective: Vitals:   04/04/20 1024  BP: (!) 143/82  Pulse: 63  SpO2: 100%  Weight: 161 lb 12.8 oz (73.4 kg)   Body mass index is 26.12 kg/m.  Physical Exam Constitutional:      Comments: He is in good spirits.  Cardiovascular:     Rate and Rhythm: Normal rate and regular rhythm.     Heart sounds: No murmur heard.   Pulmonary:     Effort: Pulmonary effort is normal.     Breath sounds: Normal breath sounds.  Abdominal:     General: There is no distension.     Palpations: Abdomen is soft. There is no mass.     Tenderness: There is no abdominal tenderness.     Lab Results CMP     Component Value Date/Time   NA  141 03/20/2020 0931   K 4.3 03/20/2020 0931   CL 106 03/20/2020 0931   CO2 28 03/20/2020 0931   GLUCOSE 89 03/20/2020 0931   BUN 19 03/20/2020 0931   CREATININE 0.90 03/20/2020 0931   CALCIUM 9.6 03/20/2020 0931   PROT 6.8 03/20/2020 0931   ALBUMIN 4.3 02/25/2017 1023   AST 16 03/20/2020 0931   ALT 18 03/20/2020 0931   ALKPHOS 54 02/25/2017 1023   BILITOT 0.7 03/20/2020 0931   GFRNONAA >60 07/23/2017 0540   GFRAA >60 07/23/2017 0540   Hepatitis B E antigen 03/20/2020: Negative Hepatitis B E antibody 03/20/2020: Positive Hepatitis B DNA viral load 03/20/2020: 763     Problem List Items Addressed This Visit      High   Hepatitis B    His chronic hepatitis B remains stable.  Currently there is no indication to start antiviral therapy.  I will update his abdominal ultrasound.  He will follow-up after lab work including a fibrotest in 1 year.      Relevant Orders   Comprehensive metabolic panel   Hepatitis B DNA, ultraquantitative,  PCR   Hepatitis B e antibody   Hepatitis B e antigen   Liver Fibrosis, FibroTest-ActiTest   US Abdomen Limited RUQ       Michel Bickers, MD [facility] for Infectious [place] [phone] pager   [phone] cell 04/04/2020, 10:59 AM

## 2020-04-04 NOTE — Assessment & Plan Note (Signed)
His chronic hepatitis B remains stable.  Currently there is no indication to start antiviral therapy.  I will update his abdominal ultrasound.  He will follow-up after lab work including a fibrotest in 1 year.

## 2020-04-09 ENCOUNTER — Ambulatory Visit: Payer: BC Managed Care – PPO | Admitting: Internal Medicine

## 2021-01-28 ENCOUNTER — Ambulatory Visit
Admission: RE | Admit: 2021-01-28 | Discharge: 2021-01-28 | Disposition: A | Discharge status: Home / Self Care | Payer: BC Managed Care – PPO | Source: Ambulatory Visit | Attending: Internal Medicine | Admitting: Internal Medicine

## 2021-01-28 DIAGNOSIS — B162 Acute hepatitis B without delta-agent with hepatic coma: Secondary | ICD-10-CM

## 2021-03-18 ENCOUNTER — Other Ambulatory Visit: Payer: Self-pay

## 2021-03-18 ENCOUNTER — Other Ambulatory Visit: Payer: BC Managed Care – PPO

## 2021-03-18 DIAGNOSIS — B162 Acute hepatitis B without delta-agent with hepatic coma: Secondary | ICD-10-CM

## 2021-03-23 LAB — LIVER FIBROSIS, FIBROTEST-ACTITEST
ALT: 17 U/L (ref 9–46)
Alpha-2-Macroglobulin: 276 mg/dL (ref 106–279)
Apolipoprotein A1: 156 mg/dL (ref 94–176)
Bilirubin: 0.7 mg/dL (ref 0.2–1.2)
Fibrosis Score: 0.41
GGT: 17 U/L (ref 3–70)
Haptoglobin: 113 mg/dL (ref 43–212)
Necroinflammat ACT Score: 0.07
Reference ID: 3929652

## 2021-03-23 LAB — COMPREHENSIVE METABOLIC PANEL
AG Ratio: 1.7 (calc) (ref 1.0–2.5)
ALT: 16 U/L (ref 9–46)
AST: 18 U/L (ref 10–35)
Albumin: 4.5 g/dL (ref 3.6–5.1)
Alkaline phosphatase (APISO): 48 U/L (ref 35–144)
BUN: 17 mg/dL (ref 7–25)
CO2: 28 mmol/L (ref 20–32)
Calcium: 9.5 mg/dL (ref 8.6–10.3)
Chloride: 106 mmol/L (ref 98–110)
Creat: 0.9 mg/dL (ref 0.70–1.25)
Globulin: 2.6 g/dL (calc) (ref 1.9–3.7)
Glucose, Bld: 96 mg/dL (ref 65–99)
Potassium: 5 mmol/L (ref 3.5–5.3)
Sodium: 140 mmol/L (ref 135–146)
Total Bilirubin: 0.5 mg/dL (ref 0.2–1.2)
Total Protein: 7.1 g/dL (ref 6.1–8.1)

## 2021-03-23 LAB — HEPATITIS B E ANTIGEN: Hep B E Ag: NONREACTIVE

## 2021-03-23 LAB — HEPATITIS B DNA, ULTRAQUANTITATIVE, PCR
Hepatitis B DNA (Calc): 1.3 Log IU/mL — ABNORMAL HIGH
Hepatitis B DNA: 20 IU/mL — ABNORMAL HIGH

## 2021-03-23 LAB — HEPATITIS B E ANTIBODY: Hep B E Ab: REACTIVE — AB

## 2021-04-02 ENCOUNTER — Other Ambulatory Visit: Payer: Self-pay

## 2021-04-02 ENCOUNTER — Ambulatory Visit (INDEPENDENT_AMBULATORY_CARE_PROVIDER_SITE_OTHER): Payer: BC Managed Care – PPO | Admitting: Internal Medicine

## 2021-04-02 ENCOUNTER — Encounter: Payer: Self-pay | Admitting: Internal Medicine

## 2021-04-02 DIAGNOSIS — B162 Acute hepatitis B without delta-agent with hepatic coma: Secondary | ICD-10-CM | POA: Diagnosis not present

## 2021-04-02 NOTE — Progress Notes (Signed)
[place] for Infectious Disease  Patient Active Problem List   Diagnosis Date Noted   Hepatitis B 07/30/2015    Priority: High   Osteoarthritis of left hip 07/22/2017   Alcohol dependence ([place]) 09/09/2015   Hemorrhoids 07/30/2015    Patient's Medications  New Prescriptions   No medications on file  Previous Medications   ASPIRIN 81 MG CHEWABLE TABLET    Chew 1 tablet (81 mg total) by mouth 2 (two) times daily.   DOCUSATE SODIUM (COLACE) 100 MG CAPSULE    Take 1 capsule (100 mg total) by mouth 2 (two) times daily.   HYDROCODONE-ACETAMINOPHEN (NORCO/VICODIN) 5-325 MG TABLET    Take 1-2 tablets by mouth every 6 (six) hours as needed for moderate pain.   MELOXICAM (MOBIC) 15 MG TABLET    Take 15 mg by mouth at bedtime.    NAPROXEN (NAPROSYN) 500 MG TABLET    Take by mouth.   ONDANSETRON (ZOFRAN) 4 MG TABLET    Take 1 tablet (4 mg total) by mouth every 6 (six) hours as needed for nausea.   SENNA (SENOKOT) 8.6 MG TABS TABLET    Take 2 tablets (17.2 mg total) by mouth at bedtime.   TADALAFIL (CIALIS) 10 MG TABLET    Take by mouth.  Modified Medications   No medications on file  Discontinued Medications   No medications on file    Subjective: James Arroyo is in for his routine visit.  He is feeling well.  He is now almost 5 years sober from alcohol.  Review of Systems: Review of Systems  Constitutional:  Negative for fever.  Gastrointestinal:  Negative for abdominal pain, diarrhea, nausea and vomiting.  Psychiatric/Behavioral:  Negative for depression.    Past Medical History:  Diagnosis Date   Hemorrhoids     Social History   Tobacco Use   Smoking status: Former    Pack years: 0.00    Types: Cigarettes    Quit date: 1994    Years since quitting: 28.5   Smokeless tobacco: Never  Vaping Use   Vaping Use: Never used  Substance Use Topics   Alcohol use: No    Comment: Recovering alcoholic   Drug use: No    Family History  Problem Relation Age of Onset    Cancer Mother    Kidney disease Father    Hypertension Father    Colon cancer Neg Hx    Pancreatic cancer Neg Hx    Stomach cancer Neg Hx     No Known Allergies  Objective: Vitals:   04/02/21 0943  BP: 118/71  Pulse: (!) 59  Temp: 98.4 F (36.9 C)  TempSrc: Oral  SpO2: 100%  Weight: 149 lb (67.6 kg)   Body mass index is 24.05 kg/m.  Physical Exam Constitutional:      Comments: Spirits are good.  Cardiovascular:     Rate and Rhythm: Normal rate.  Pulmonary:     Effort: Pulmonary effort is normal.  Abdominal:     Palpations: Abdomen is soft. There is no mass.  Psychiatric:        Mood and Affect: Mood normal.    Lab Results 03/18/2021 Liver enzymes normal Hepatitis B E antigen negative Hepatitis B DNA viral load 20 Fibrotest F1-F0  Abdominal ultrasound 01/28/2021  IMPRESSION: 1. No evidence of cholelithiasis or cholecystitis. 2. Trace pericholecystic fluid, nonspecific. 3. Heterogeneous increased liver echotexture, compatible with given history of chronic hepatitis. No focal liver abnormalities.  Problem  List Items Addressed This Visit       High   Hepatitis B    There is still no current indication to start treatment for his chronic hepatitis B.  He will follow-up after lab work in 1 year.       Relevant Orders   Comprehensive metabolic panel   Hepatitis B DNA, ultraquantitative, PCR   Hepatitis B e antibody   Hepatitis B e antigen   Liver Fibrosis, FibroTest-ActiTest     Michel Bickers, MD [facility] for [facility] [phone] pager   [phone] cell 04/02/2021, 10:08 AM

## 2021-04-02 NOTE — Assessment & Plan Note (Signed)
There is still no current indication to start treatment for his chronic hepatitis B.  He will follow-up after lab work in 1 year.

## 2021-09-23 DIAGNOSIS — R6889 Other general symptoms and signs: Secondary | ICD-10-CM | POA: Diagnosis not present

## 2021-09-23 DIAGNOSIS — R509 Fever, unspecified: Secondary | ICD-10-CM | POA: Diagnosis not present

## 2022-04-01 ENCOUNTER — Other Ambulatory Visit: Payer: BC Managed Care – PPO

## 2022-04-01 ENCOUNTER — Other Ambulatory Visit: Payer: Self-pay

## 2022-04-01 DIAGNOSIS — B162 Acute hepatitis B without delta-agent with hepatic coma: Secondary | ICD-10-CM | POA: Diagnosis not present

## 2022-04-06 LAB — LIVER FIBROSIS, FIBROTEST-ACTITEST
ALT: 15 U/L (ref 9–46)
Alpha-2-Macroglobulin: 234 mg/dL (ref 106–279)
Apolipoprotein A1: 154 mg/dL (ref 94–176)
Bilirubin: 0.4 mg/dL (ref 0.2–1.2)
Fibrosis Score: 0.27
GGT: 15 U/L (ref 3–70)
Haptoglobin: 91 mg/dL (ref 43–212)
Necroinflammat ACT Score: 0.05
Reference ID: 4455451

## 2022-04-06 LAB — COMPREHENSIVE METABOLIC PANEL
AG Ratio: 2 (calc) (ref 1.0–2.5)
ALT: 17 U/L (ref 9–46)
AST: 17 U/L (ref 10–35)
Albumin: 4.5 g/dL (ref 3.6–5.1)
Alkaline phosphatase (APISO): 52 U/L (ref 35–144)
BUN: 18 mg/dL (ref 7–25)
CO2: 29 mmol/L (ref 20–32)
Calcium: 9.4 mg/dL (ref 8.6–10.3)
Chloride: 103 mmol/L (ref 98–110)
Creat: 1.09 mg/dL (ref 0.70–1.35)
Globulin: 2.2 g/dL (calc) (ref 1.9–3.7)
Glucose, Bld: 78 mg/dL (ref 65–99)
Potassium: 4.1 mmol/L (ref 3.5–5.3)
Sodium: 140 mmol/L (ref 135–146)
Total Bilirubin: 0.4 mg/dL (ref 0.2–1.2)
Total Protein: 6.7 g/dL (ref 6.1–8.1)

## 2022-04-06 LAB — HEPATITIS B DNA, ULTRAQUANTITATIVE, PCR
Hepatitis B DNA: 39 IU/mL — ABNORMAL HIGH
Hepatitis B virus DNA: 1.59 Log IU/mL — ABNORMAL HIGH

## 2022-04-06 LAB — HEPATITIS B E ANTIBODY: Hep B E Ab: REACTIVE — AB

## 2022-04-06 LAB — HEPATITIS B E ANTIGEN: Hep B E Ag: NONREACTIVE

## 2022-04-15 ENCOUNTER — Ambulatory Visit: Payer: BC Managed Care – PPO | Admitting: Internal Medicine

## 2022-04-15 ENCOUNTER — Other Ambulatory Visit: Payer: Self-pay

## 2022-04-15 ENCOUNTER — Encounter: Payer: Self-pay | Admitting: Internal Medicine

## 2022-04-15 DIAGNOSIS — B169 Acute hepatitis B without delta-agent and without hepatic coma: Secondary | ICD-10-CM | POA: Diagnosis not present

## 2022-04-15 NOTE — Assessment & Plan Note (Signed)
He has chronic hepatitis B with very low and stable viral loads and no evidence of progressive fibrosis.  I will continue observation off of antiviral therapy.  He will follow-up in 1 year.

## 2022-04-15 NOTE — Progress Notes (Signed)
[place] for Infectious Disease  Patient Active Problem List   Diagnosis Date Noted   Hepatitis B 07/30/2015    Priority: High   Osteoarthritis of left hip 07/22/2017   Alcohol dependence ([place]) 09/09/2015   Hemorrhoids 07/30/2015    Patient's Medications  New Prescriptions   No medications on file  Previous Medications   ASPIRIN 81 MG CHEWABLE TABLET    Chew 1 tablet (81 mg total) by mouth 2 (two) times daily.   DOCUSATE SODIUM (COLACE) 100 MG CAPSULE    Take 1 capsule (100 mg total) by mouth 2 (two) times daily.   HYDROCODONE-ACETAMINOPHEN (NORCO/VICODIN) 5-325 MG TABLET    Take 1-2 tablets by mouth every 6 (six) hours as needed for moderate pain.   MELOXICAM (MOBIC) 15 MG TABLET    Take 15 mg by mouth at bedtime.    NAPROXEN (NAPROSYN) 500 MG TABLET    Take by mouth.   ONDANSETRON (ZOFRAN) 4 MG TABLET    Take 1 tablet (4 mg total) by mouth every 6 (six) hours as needed for nausea.   SENNA (SENOKOT) 8.6 MG TABS TABLET    Take 2 tablets (17.2 mg total) by mouth at bedtime.   TADALAFIL (CIALIS) 10 MG TABLET    Take by mouth.  Modified Medications   No medications on file  Discontinued Medications   No medications on file    Subjective: James Arroyo is in for his routine follow-up visit.  He is feeling well.  He is looking to a trip to the beach in October.  He has been sober and off alcohol for 5 years and 10 months.  Review of Systems: Review of Systems  Constitutional:  Negative for fever and weight loss.    Past Medical History:  Diagnosis Date   Hemorrhoids     Social History   Tobacco Use   Smoking status: Former    Types: Cigarettes    Quit date: 1994    Years since quitting: 29.5   Smokeless tobacco: Never  Vaping Use   Vaping Use: Never used  Substance Use Topics   Alcohol use: No    Comment: Recovering alcoholic   Drug use: No    Family History  Problem Relation Age of Onset   Cancer Mother    Kidney disease Father    Hypertension  Father    Colon cancer Neg Hx    Pancreatic cancer Neg Hx    Stomach cancer Neg Hx     No Known Allergies  Objective: Vitals:   04/15/22 1014  BP: 117/75  Pulse: 78  Temp: 98.4 F (36.9 C)  TempSrc: Oral  SpO2: 96%  Weight: 156 lb (70.8 kg)   Body mass index is 25.18 kg/m.  Physical Exam Constitutional:      Comments: His spirits are good.  Cardiovascular:     Rate and Rhythm: Normal rate.  Pulmonary:     Effort: Pulmonary effort is normal.  Psychiatric:        Mood and Affect: Mood normal.     Lab Results 04/01/2022 CMP     Component Value Date/Time   NA 140 04/01/2022 1028   K 4.1 04/01/2022 1028   CL 103 04/01/2022 1028   CO2 29 04/01/2022 1028   GLUCOSE 78 04/01/2022 1028   BUN 18 04/01/2022 1028   CREATININE 1.09 04/01/2022 1028   CALCIUM 9.4 04/01/2022 1028   PROT 6.7 04/01/2022 1028   ALBUMIN 4.3 02/25/2017 1023  AST 17 04/01/2022 1028   ALT 15 04/01/2022 1028   ALT 17 04/01/2022 1028   ALKPHOS 54 02/25/2017 1023   BILITOT 0.4 04/01/2022 1028   GFRNONAA >60 07/23/2017 0540   GFRAA >60 07/23/2017 0540    Hepatitis B e antigen negative Hepatitis B e antibody positive Hepatitis B DNA viral load 39 Fibrosis score F1   Problem List Items Addressed This Visit       High   Hepatitis B    He has chronic hepatitis B with very low and stable viral loads and no evidence of progressive fibrosis.  I will continue observation off of antiviral therapy.  He will follow-up in 1 year.      Relevant Orders   Hepatitis B DNA, ultraquantitative, PCR   Hepatitis B e antibody   Hepatitis B e antigen   Comprehensive metabolic panel     Michel Bickers, MD [facility] for Infectious [place] [phone] pager   [phone] cell 04/15/2022, 10:50 AM

## 2022-11-05 DIAGNOSIS — Y92410 Unspecified street and highway as the place of occurrence of the external cause: Secondary | ICD-10-CM | POA: Diagnosis not present

## 2022-11-05 DIAGNOSIS — Z86010 Personal history of colonic polyps: Secondary | ICD-10-CM | POA: Diagnosis not present

## 2022-11-05 DIAGNOSIS — Z96642 Presence of left artificial hip joint: Secondary | ICD-10-CM | POA: Diagnosis not present

## 2022-11-05 DIAGNOSIS — R102 Pelvic and perineal pain: Secondary | ICD-10-CM | POA: Diagnosis not present

## 2022-11-05 DIAGNOSIS — F32A Depression, unspecified: Secondary | ICD-10-CM | POA: Diagnosis not present

## 2022-11-05 DIAGNOSIS — M25552 Pain in left hip: Secondary | ICD-10-CM | POA: Diagnosis not present

## 2022-11-05 DIAGNOSIS — Z96641 Presence of right artificial hip joint: Secondary | ICD-10-CM | POA: Diagnosis not present

## 2022-11-05 DIAGNOSIS — K219 Gastro-esophageal reflux disease without esophagitis: Secondary | ICD-10-CM | POA: Diagnosis not present

## 2022-11-05 DIAGNOSIS — Z87891 Personal history of nicotine dependence: Secondary | ICD-10-CM | POA: Diagnosis not present

## 2022-12-07 ENCOUNTER — Other Ambulatory Visit: Payer: Self-pay

## 2022-12-07 ENCOUNTER — Other Ambulatory Visit: Payer: BC Managed Care – PPO

## 2022-12-07 DIAGNOSIS — B169 Acute hepatitis B without delta-agent and without hepatic coma: Secondary | ICD-10-CM | POA: Diagnosis not present

## 2022-12-11 LAB — COMPREHENSIVE METABOLIC PANEL
AG Ratio: 1.8 (calc) (ref 1.0–2.5)
ALT: 18 U/L (ref 9–46)
AST: 18 U/L (ref 10–35)
Albumin: 4.8 g/dL (ref 3.6–5.1)
Alkaline phosphatase (APISO): 57 U/L (ref 35–144)
BUN: 15 mg/dL (ref 7–25)
CO2: 27 mmol/L (ref 20–32)
Calcium: 9.8 mg/dL (ref 8.6–10.3)
Chloride: 104 mmol/L (ref 98–110)
Creat: 0.87 mg/dL (ref 0.70–1.35)
Globulin: 2.7 g/dL (calc) (ref 1.9–3.7)
Glucose, Bld: 99 mg/dL (ref 65–99)
Potassium: 4.2 mmol/L (ref 3.5–5.3)
Sodium: 140 mmol/L (ref 135–146)
Total Bilirubin: 0.7 mg/dL (ref 0.2–1.2)
Total Protein: 7.5 g/dL (ref 6.1–8.1)

## 2022-12-11 LAB — HEPATITIS B E ANTIBODY: Hep B E Ab: REACTIVE — AB

## 2022-12-11 LAB — HEPATITIS B DNA, ULTRAQUANTITATIVE, PCR
Hepatitis B DNA: 105 IU/mL — ABNORMAL HIGH
Hepatitis B virus DNA: 2.02 Log IU/mL — ABNORMAL HIGH

## 2022-12-11 LAB — HEPATITIS B E ANTIGEN: Hep B E Ag: NONREACTIVE

## 2022-12-23 ENCOUNTER — Encounter: Payer: Self-pay | Admitting: Internal Medicine

## 2022-12-23 ENCOUNTER — Other Ambulatory Visit: Payer: Self-pay

## 2022-12-23 ENCOUNTER — Ambulatory Visit: Payer: BC Managed Care – PPO | Admitting: Internal Medicine

## 2022-12-23 DIAGNOSIS — B169 Acute hepatitis B without delta-agent and without hepatic coma: Secondary | ICD-10-CM | POA: Diagnosis not present

## 2022-12-23 NOTE — Progress Notes (Signed)
[facility]  Patient Active Problem List   Diagnosis Date Noted   Hepatitis B 07/30/2015    Priority: High   Osteoarthritis of left hip 07/22/2017   Alcohol dependence 09/09/2015   Hemorrhoids 07/30/2015    Patient's Medications  New Prescriptions   No medications on file  Previous Medications   ASPIRIN 81 MG CHEWABLE TABLET    Chew 1 tablet (81 mg total) by mouth 2 (two) times daily.   DOCUSATE SODIUM (COLACE) 100 MG CAPSULE    Take 1 capsule (100 mg total) by mouth 2 (two) times daily.   HYDROCODONE-ACETAMINOPHEN (NORCO/VICODIN) 5-325 MG TABLET    Take 1-2 tablets by mouth every 6 (six) hours as needed for moderate pain.   MELOXICAM (MOBIC) 15 MG TABLET    Take 15 mg by mouth at bedtime.   NAPROXEN (NAPROSYN) 500 MG TABLET    Take by mouth.   ONDANSETRON (ZOFRAN) 4 MG TABLET    Take 1 tablet (4 mg total) by mouth every 6 (six) hours as needed for nausea.   SENNA (SENOKOT) 8.6 MG TABS TABLET    Take 2 tablets (17.2 mg total) by mouth at bedtime.   TADALAFIL (CIALIS) 10 MG TABLET    Take by mouth.  Modified Medications   No medications on file  Discontinued Medications   No medications on file    Subjective: James Arroyo is in for his routine hepatitis B follow-up visit.  He is feeling well.  He remains sober and alcohol free for many years.  He tells me that one of his sons is getting married this October.  Review of Systems: Review of Systems  Constitutional:  Negative for fever and weight loss.    Past Medical History:  Diagnosis Date   Hemorrhoids     Social History   Tobacco Use   Smoking status: Former    Types: Cigarettes    Quit date: 1994    Years since quitting: 30.2   Smokeless tobacco: Never  Vaping Use   Vaping Use: Never used  Substance Use Topics   Alcohol use: No    Comment: Recovering alcoholic   Drug use: No    Family History  Problem Relation Age of Onset   Cancer Mother    Kidney disease Father     Hypertension Father    Colon cancer Neg Hx    Pancreatic cancer Neg Hx    Stomach cancer Neg Hx     No Known Allergies  Objective: Vitals:   12/23/22 0922  BP: 104/68  Pulse: 82  Temp: (!) 96.2 F (35.7 C)  SpO2: 97%  Weight: 149 lb (67.6 kg)  Height: 5\' 6"  (1.676 m)   Body mass index is 24.05 kg/m.  Physical Exam Constitutional:      Comments: He is calm and pleasant as usual.  Cardiovascular:     Rate and Rhythm: Normal rate.  Pulmonary:     Effort: Pulmonary effort is normal.  Psychiatric:        Mood and Affect: Mood normal.     Lab Results 12/07/2022 Liver enzymes normal Hepatitis B e antigen remains negative Hepatitis B e antibody remains positive Hepatitis B DNA viral load 105    Problem List Items Addressed This Visit       High   Hepatitis B    His hepatitis B viral load remains low and stable and there are no indications of progressive complications.  His  ultrasound 2 years ago showed some coarsened echotexture which may be more related to prior alcoholism than 2 chronic hepatitis B.  I do not think that his condition warrants treatment currently.  I recommend a follow-up ultrasound within the next year.  He will have repeat lab work and follow-up in 1 year.      Relevant Orders   Comprehensive metabolic panel   CBC   Hepatitis B DNA, ultraquantitative, PCR   Hepatitis B e antibody   Hepatitis B e antigen   Liver Fibrosis, FibroTest-ActiTest     Michel Bickers, MD [facility] for [facility] [place] [facility] [phone] pager   [phone] cell 12/23/2022, 10:24 AM

## 2022-12-23 NOTE — Assessment & Plan Note (Signed)
His hepatitis B viral load remains low and stable and there are no indications of progressive complications.  His ultrasound 2 years ago showed some coarsened echotexture which may be more related to prior alcoholism than 2 chronic hepatitis B.  I do not think that his condition warrants treatment currently.  I recommend a follow-up ultrasound within the next year.  He will have repeat lab work and follow-up in 1 year.

## 2023-03-31 ENCOUNTER — Other Ambulatory Visit: Payer: BC Managed Care – PPO

## 2023-04-14 ENCOUNTER — Ambulatory Visit: Payer: BC Managed Care – PPO | Admitting: Internal Medicine

## 2023-07-12 DIAGNOSIS — R221 Localized swelling, mass and lump, neck: Secondary | ICD-10-CM | POA: Insufficient documentation

## 2023-11-19 ENCOUNTER — Other Ambulatory Visit: Payer: Self-pay

## 2023-11-19 DIAGNOSIS — B169 Acute hepatitis B without delta-agent and without hepatic coma: Secondary | ICD-10-CM

## 2023-11-23 ENCOUNTER — Other Ambulatory Visit: Payer: Self-pay

## 2023-11-23 ENCOUNTER — Other Ambulatory Visit: Payer: BC Managed Care – PPO

## 2023-11-23 DIAGNOSIS — B169 Acute hepatitis B without delta-agent and without hepatic coma: Secondary | ICD-10-CM

## 2023-11-27 LAB — LIVER FIBROSIS, FIBROTEST-ACTITEST
ALT: 15 U/L (ref 9–46)
Alpha-2-Macroglobulin: 250 mg/dL (ref 106–279)
Apolipoprotein A1: 164 mg/dL (ref 94–176)
Bilirubin: 0.4 mg/dL (ref 0.2–1.2)
Fibrosis Score: 0.28
GGT: 16 U/L (ref 3–70)
Haptoglobin: 102 mg/dL (ref 43–212)
Necroinflammat ACT Score: 0.05
Reference ID: 5372700

## 2023-11-27 LAB — COMPLETE METABOLIC PANEL WITH GFR
AG Ratio: 2 (calc) (ref 1.0–2.5)
ALT: 15 U/L (ref 9–46)
AST: 14 U/L (ref 10–35)
Albumin: 4.6 g/dL (ref 3.6–5.1)
Alkaline phosphatase (APISO): 64 U/L (ref 35–144)
BUN: 24 mg/dL (ref 7–25)
CO2: 27 mmol/L (ref 20–32)
Calcium: 9.4 mg/dL (ref 8.6–10.3)
Chloride: 107 mmol/L (ref 98–110)
Creat: 0.83 mg/dL (ref 0.70–1.35)
Globulin: 2.3 g/dL (ref 1.9–3.7)
Glucose, Bld: 69 mg/dL (ref 65–99)
Potassium: 4.2 mmol/L (ref 3.5–5.3)
Sodium: 141 mmol/L (ref 135–146)
Total Bilirubin: 0.4 mg/dL (ref 0.2–1.2)
Total Protein: 6.9 g/dL (ref 6.1–8.1)
eGFR: 98 mL/min/{1.73_m2} (ref >=60)

## 2023-11-27 LAB — CBC WITH DIFFERENTIAL/PLATELET
Absolute Lymphocytes: 950 {cells}/uL (ref 850–3900)
Absolute Monocytes: 385 {cells}/uL (ref 200–950)
Basophils Absolute: 22 {cells}/uL (ref 0–200)
Basophils Relative: 0.6 %
Eosinophils Absolute: 162 {cells}/uL (ref 15–500)
Eosinophils Relative: 4.5 %
HCT: 40.9 % (ref 38.5–50.0)
Hemoglobin: 14 g/dL (ref 13.2–17.1)
MCH: 33.7 pg — ABNORMAL HIGH (ref 27.0–33.0)
MCHC: 34.2 g/dL (ref 32.0–36.0)
MCV: 98.3 fL (ref 80.0–100.0)
MPV: 8.8 fL (ref 7.5–12.5)
Monocytes Relative: 10.7 %
Neutro Abs: 2081 {cells}/uL (ref 1500–7800)
Neutrophils Relative %: 57.8 %
Platelets: 257 10*3/uL (ref 140–400)
RBC: 4.16 10*6/uL — ABNORMAL LOW (ref 4.20–5.80)
RDW: 12.6 % (ref 11.0–15.0)
Total Lymphocyte: 26.4 %
WBC: 3.6 10*3/uL — ABNORMAL LOW (ref 3.8–10.8)

## 2023-11-27 LAB — HEPATITIS B DNA, ULTRAQUANTITATIVE, PCR
Hepatitis B DNA: 49 [IU]/mL — ABNORMAL HIGH
Hepatitis B virus DNA: 1.69 {Log_IU}/mL — ABNORMAL HIGH

## 2023-11-27 LAB — HEPATITIS B E ANTIBODY: Hep B E Ab: REACTIVE — AB

## 2023-11-27 LAB — HEPATITIS B E ANTIGEN: Hep B E Ag: NONREACTIVE

## 2023-12-07 ENCOUNTER — Ambulatory Visit: Payer: Self-pay | Admitting: Internal Medicine

## 2023-12-07 ENCOUNTER — Encounter: Payer: Self-pay | Admitting: Internal Medicine

## 2023-12-07 ENCOUNTER — Other Ambulatory Visit: Payer: Self-pay

## 2023-12-07 VITALS — BP 119/79 | HR 68 | Resp 16 | Ht 66.0 in | Wt 159.5 lb

## 2023-12-07 DIAGNOSIS — B181 Chronic viral hepatitis B without delta-agent: Secondary | ICD-10-CM

## 2023-12-07 NOTE — Patient Instructions (Signed)
 Please review hcc screening   We will do once a year for now  See me in a year   Based on numbers no need for medication yet

## 2023-12-07 NOTE — Progress Notes (Signed)
 [facility]  Patient Active Problem List   Diagnosis Date Noted   Neck mass 07/12/2023   Osteoarthritis of left hip 07/22/2017   Alcohol dependence ([facility]) 09/09/2015   Hepatitis B 07/30/2015   Hemorrhoids 07/30/2015    Patient's Medications  New Prescriptions   No medications on file  Previous Medications   AMOXICILLIN (AMOXIL) 500 MG CAPSULE    Take 500 mg by mouth 3 (three) times daily. Dental work Only   ASPIRIN 81 MG CHEWABLE TABLET    Chew 1 tablet (81 mg total) by mouth 2 (two) times daily.   DOCUSATE SODIUM (COLACE) 100 MG CAPSULE    Take 1 capsule (100 mg total) by mouth 2 (two) times daily.   HYDROCODONE-ACETAMINOPHEN (NORCO/VICODIN) 5-325 MG TABLET    Take 1-2 tablets by mouth every 6 (six) hours as needed for moderate pain.   MELOXICAM (MOBIC) 15 MG TABLET    Take 15 mg by mouth at bedtime.   NAPROXEN (NAPROSYN) 500 MG TABLET    Take by mouth.   ONDANSETRON (ZOFRAN) 4 MG TABLET    Take 1 tablet (4 mg total) by mouth every 6 (six) hours as needed for nausea.   SENNA (SENOKOT) 8.6 MG TABS TABLET    Take 2 tablets (17.2 mg total) by mouth at bedtime.   TADALAFIL (CIALIS) 10 MG TABLET    Take by mouth.   TYLENOL 500 MG TABLET    TAKE 2 TABLETS BY MOUTH EVERY 8 HOURS AS NEEDED FOR MILD TO MODERATE PAIN FOR UP TO 3 DAYS  Modified Medications   No medications on file  Discontinued Medications   No medications on file    Subjective: James Arroyo is in for his routine hepatitis B follow-up visit.  He is feeling well.  He remains sober and alcohol free for many years.    12/07/23 id clinic f/u Patient is here for hep b f/u He is not taking any hep b medication yet He has had liver u/s for hcc sreening every 2 years and they have been normal No family hx cirrhosis/hcc Patient had stopped heavy drinking as of 7 years ago He has no diabetes He has no smoking > 30 years  I reviewed labs with him 11/23/23 alt 15; apri score 0.157 (so <0.5 or  equivalent of fibrosis stage 2) Hbv dna 49; hep b eAb positive Fibrosure test F1 stage 01/2021 was last liver ultrasound 1. No evidence of cholelithiasis or cholecystitis. 2. Trace pericholecystic fluid, nonspecific. 3. Heterogeneous increased liver echotexture, compatible with given history of chronic hepatitis. No focal liver abnormalities.     Review of Systems: ROS  Past Medical History:  Diagnosis Date   Hemorrhoids     Social History   Tobacco Use   Smoking status: Former    Current packs/day: 0.00    Types: Cigarettes    Quit date: 1994    Years since quitting: 31.2   Smokeless tobacco: Never  Vaping Use   Vaping status: Never Used  Substance Use Topics   Alcohol use: No    Comment: Recovering alcoholic   Drug use: No    Family History  Problem Relation Age of Onset   Cancer Mother    Kidney disease Father    Hypertension Father    Colon cancer Neg Hx    Pancreatic cancer Neg Hx    Stomach cancer Neg Hx     No Known Allergies  Objective: Vitals:  12/07/23 0906  BP: 119/79  Pulse: 68  Resp: 16  Weight: 159 lb 8 oz (72.3 kg)  Height: 5\' 6"  (1.676 m)   Body mass index is 25.74 kg/m.  Physical Exam Constitutional:      Comments: He is calm and pleasant as usual.  Cardiovascular:     Rate and Rhythm: Normal rate.  Pulmonary:     Effort: Pulmonary effort is normal.  Psychiatric:        Mood and Affect: Mood normal.    Hep b labs and imaging: See hpi    Problem List Items Addressed This Visit   None   #hep b without hep d  Eag negative; eab positive Apri score 11/2023 0.157 (so stage F1) Alt in low 10s Fibrosure test also puts him at f1 2022 liver u/s no hcc No family hx hcc  [employer] by who guideline 2024 no need for hep b treatment We discussed more frequent hcc screening -- for now will do once a year  Article about twice a year printed for patient  -f/u in 1 year and we can do labs at that time -u/s liver  scheduled -educational materials provided    James Band, MD [facility] for Infectious Disease [facility] [phone] pager   [phone] cell 12/07/2023, 9:16 AM

## 2023-12-07 NOTE — Addendum Note (Signed)
 Addended byRutha Bouchard T on: 12/07/2023 09:45 AM   Modules accepted: Orders

## 2024-12-12 ENCOUNTER — Ambulatory Visit: Admitting: Internal Medicine
